# Patient Record
Sex: Female | Born: 1990 | Race: Black or African American | Hispanic: No | Marital: Single | Smoking: Current some day smoker
Health system: Southern US, Community
[De-identification: ages and names within clinical notes are randomized; demographics above are authoritative.]

## PROBLEM LIST (undated history)

## (undated) ENCOUNTER — Inpatient Hospital Stay (HOSPITAL_COMMUNITY): Payer: Self-pay

## (undated) DIAGNOSIS — S81839A Puncture wound without foreign body, unspecified lower leg, initial encounter: Secondary | ICD-10-CM

## (undated) DIAGNOSIS — A5900 Urogenital trichomoniasis, unspecified: Secondary | ICD-10-CM

## (undated) DIAGNOSIS — W3400XA Accidental discharge from unspecified firearms or gun, initial encounter: Secondary | ICD-10-CM

## (undated) DIAGNOSIS — J45909 Unspecified asthma, uncomplicated: Secondary | ICD-10-CM

## (undated) HISTORY — PX: NO PAST SURGERIES: SHX2092

---
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
## 2009-07-02 ENCOUNTER — Emergency Department (HOSPITAL_COMMUNITY): Admission: EM | Admit: 2009-07-02 | Discharge: 2009-07-02 | Payer: Self-pay | Admitting: Emergency Medicine

## 2009-10-06 ENCOUNTER — Emergency Department (HOSPITAL_COMMUNITY): Admission: EM | Admit: 2009-10-06 | Discharge: 2009-10-06 | Payer: Self-pay | Admitting: Emergency Medicine

## 2010-01-14 ENCOUNTER — Emergency Department (HOSPITAL_COMMUNITY): Admission: EM | Admit: 2010-01-14 | Discharge: 2010-01-14 | Payer: Self-pay | Admitting: Emergency Medicine

## 2010-03-06 ENCOUNTER — Emergency Department (HOSPITAL_COMMUNITY): Admission: EM | Admit: 2010-03-06 | Discharge: 2010-03-06 | Payer: Self-pay | Admitting: Emergency Medicine

## 2010-03-19 ENCOUNTER — Emergency Department (HOSPITAL_COMMUNITY): Admission: EM | Admit: 2010-03-19 | Discharge: 2010-03-20 | Payer: Self-pay | Admitting: Emergency Medicine

## 2010-04-21 ENCOUNTER — Emergency Department (HOSPITAL_COMMUNITY): Admission: EM | Admit: 2010-04-21 | Discharge: 2010-04-22 | Payer: Self-pay | Admitting: Emergency Medicine

## 2010-04-21 DIAGNOSIS — S81839A Puncture wound without foreign body, unspecified lower leg, initial encounter: Secondary | ICD-10-CM

## 2010-04-21 HISTORY — DX: Puncture wound without foreign body, unspecified lower leg, initial encounter: S81.839A

## 2010-05-06 ENCOUNTER — Emergency Department (HOSPITAL_COMMUNITY): Admission: EM | Admit: 2010-05-06 | Discharge: 2010-05-06 | Payer: Self-pay | Admitting: Emergency Medicine

## 2010-05-14 ENCOUNTER — Emergency Department (HOSPITAL_COMMUNITY): Admission: EM | Admit: 2010-05-14 | Discharge: 2010-05-14 | Payer: Self-pay | Admitting: Emergency Medicine

## 2010-07-29 ENCOUNTER — Emergency Department (HOSPITAL_COMMUNITY)
Admission: EM | Admit: 2010-07-29 | Discharge: 2010-07-29 | Payer: Self-pay | Source: Home / Self Care | Admitting: Emergency Medicine

## 2010-07-30 ENCOUNTER — Emergency Department (HOSPITAL_COMMUNITY)
Admission: EM | Admit: 2010-07-30 | Discharge: 2010-07-30 | Payer: Self-pay | Source: Home / Self Care | Admitting: Emergency Medicine

## 2010-08-14 ENCOUNTER — Emergency Department (HOSPITAL_COMMUNITY)
Admission: EM | Admit: 2010-08-14 | Discharge: 2010-08-14 | Disposition: A | Discharge status: Home / Self Care | Payer: Self-pay | Attending: Emergency Medicine | Admitting: Emergency Medicine

## 2010-08-14 DIAGNOSIS — M79609 Pain in unspecified limb: Secondary | ICD-10-CM | POA: Insufficient documentation

## 2010-08-14 DIAGNOSIS — G8929 Other chronic pain: Secondary | ICD-10-CM | POA: Insufficient documentation

## 2010-08-14 DIAGNOSIS — M25579 Pain in unspecified ankle and joints of unspecified foot: Secondary | ICD-10-CM | POA: Insufficient documentation

## 2010-09-19 LAB — RAPID STREP SCREEN (MED CTR MEBANE ONLY): Streptococcus, Group A Screen (Direct): NEGATIVE

## 2010-09-20 LAB — RAPID STREP SCREEN (MED CTR MEBANE ONLY): Streptococcus, Group A Screen (Direct): NEGATIVE

## 2010-09-22 LAB — URINALYSIS, ROUTINE W REFLEX MICROSCOPIC
Bilirubin Urine: NEGATIVE
Glucose, UA: NEGATIVE mg/dL
Hgb urine dipstick: NEGATIVE
Ketones, ur: NEGATIVE mg/dL
Nitrite: NEGATIVE
Protein, ur: 30 mg/dL — AB
Specific Gravity, Urine: 1.017 (ref 1.005–1.030)
Urobilinogen, UA: 1 mg/dL (ref 0.0–1.0)
pH: 8.5 — ABNORMAL HIGH (ref 5.0–8.0)

## 2010-09-22 LAB — URINE MICROSCOPIC-ADD ON

## 2010-09-22 LAB — WET PREP, GENITAL
Clue Cells Wet Prep HPF POC: NONE SEEN
Trich, Wet Prep: NONE SEEN
WBC, Wet Prep HPF POC: NONE SEEN
Yeast Wet Prep HPF POC: NONE SEEN

## 2010-09-22 LAB — URINE CULTURE: Colony Count: 45000

## 2010-09-22 LAB — GC/CHLAMYDIA PROBE AMP, GENITAL
Chlamydia, DNA Probe: NEGATIVE
GC Probe Amp, Genital: NEGATIVE

## 2010-09-22 LAB — POCT PREGNANCY, URINE: Preg Test, Ur: NEGATIVE

## 2010-09-25 LAB — RAPID URINE DRUG SCREEN, HOSP PERFORMED
Amphetamines: NOT DETECTED
Barbiturates: NOT DETECTED
Benzodiazepines: NOT DETECTED
Cocaine: NOT DETECTED
Opiates: NOT DETECTED
Tetrahydrocannabinol: POSITIVE — AB

## 2010-09-25 LAB — POCT I-STAT, CHEM 8
BUN: 6 mg/dL (ref 6–23)
Calcium, Ion: 1.14 mmol/L (ref 1.12–1.32)
Chloride: 106 mEq/L (ref 96–112)
Creatinine, Ser: 0.7 mg/dL (ref 0.4–1.2)
Glucose, Bld: 85 mg/dL (ref 70–99)
HCT: 38 % (ref 36.0–46.0)
Hemoglobin: 12.9 g/dL (ref 12.0–15.0)
Potassium: 3.8 mEq/L (ref 3.5–5.1)
Sodium: 138 mEq/L (ref 135–145)
TCO2: 23 mmol/L (ref 0–100)

## 2010-09-25 LAB — POCT PREGNANCY, URINE: Preg Test, Ur: NEGATIVE

## 2010-09-25 LAB — ETHANOL: Alcohol, Ethyl (B): <5 mg/dL (ref 0–10)
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
## 2010-12-17 ENCOUNTER — Emergency Department (HOSPITAL_COMMUNITY)
Admission: EM | Admit: 2010-12-17 | Discharge: 2010-12-17 | Disposition: A | Discharge status: Home / Self Care | Payer: Medicaid Other | Attending: Emergency Medicine | Admitting: Emergency Medicine

## 2010-12-17 DIAGNOSIS — G8929 Other chronic pain: Secondary | ICD-10-CM | POA: Insufficient documentation

## 2010-12-17 DIAGNOSIS — M79609 Pain in unspecified limb: Secondary | ICD-10-CM

## 2010-12-17 DIAGNOSIS — E049 Nontoxic goiter, unspecified: Secondary | ICD-10-CM | POA: Insufficient documentation

## 2010-12-17 DIAGNOSIS — R220 Localized swelling, mass and lump, head: Secondary | ICD-10-CM | POA: Insufficient documentation

## 2010-12-17 DIAGNOSIS — Z87828 Personal history of other (healed) physical injury and trauma: Secondary | ICD-10-CM | POA: Insufficient documentation

## 2010-12-18 LAB — TSH: TSH: 3.025 u[IU]/mL (ref 0.350–4.500)

## 2010-12-18 LAB — T4, FREE: Free T4: 0.87 ng/dL (ref 0.80–1.80)

## 2011-04-05 ENCOUNTER — Inpatient Hospital Stay (INDEPENDENT_AMBULATORY_CARE_PROVIDER_SITE_OTHER)
Admission: RE | Admit: 2011-04-05 | Discharge: 2011-04-05 | Disposition: A | Discharge status: Home / Self Care | Payer: Self-pay | Source: Ambulatory Visit | Attending: Emergency Medicine | Admitting: Emergency Medicine

## 2011-04-05 DIAGNOSIS — N61 Mastitis without abscess: Secondary | ICD-10-CM

## 2011-04-08 LAB — CULTURE, ROUTINE-ABSCESS

## 2011-12-13 ENCOUNTER — Encounter (HOSPITAL_COMMUNITY): Payer: Self-pay

## 2011-12-13 ENCOUNTER — Inpatient Hospital Stay (HOSPITAL_COMMUNITY)
Admission: AD | Admit: 2011-12-13 | Discharge: 2011-12-13 | Disposition: A | Discharge status: Home / Self Care | Payer: Medicaid Other | Source: Ambulatory Visit | Attending: Obstetrics & Gynecology | Admitting: Obstetrics & Gynecology

## 2011-12-13 ENCOUNTER — Inpatient Hospital Stay (HOSPITAL_COMMUNITY): Payer: Self-pay

## 2011-12-13 DIAGNOSIS — M25559 Pain in unspecified hip: Secondary | ICD-10-CM | POA: Insufficient documentation

## 2011-12-13 DIAGNOSIS — O99891 Other specified diseases and conditions complicating pregnancy: Secondary | ICD-10-CM | POA: Insufficient documentation

## 2011-12-13 DIAGNOSIS — R109 Unspecified abdominal pain: Secondary | ICD-10-CM | POA: Insufficient documentation

## 2011-12-13 DIAGNOSIS — O2690 Pregnancy related conditions, unspecified, unspecified trimester: Secondary | ICD-10-CM

## 2011-12-13 HISTORY — DX: Accidental discharge from unspecified firearms or gun, initial encounter: W34.00XA

## 2011-12-13 HISTORY — DX: Puncture wound without foreign body, unspecified lower leg, initial encounter: S81.839A

## 2011-12-13 LAB — POCT PREGNANCY, URINE: Preg Test, Ur: POSITIVE — AB

## 2011-12-13 LAB — URINALYSIS, ROUTINE W REFLEX MICROSCOPIC
Bilirubin Urine: NEGATIVE
Glucose, UA: NEGATIVE mg/dL
Hgb urine dipstick: NEGATIVE
Ketones, ur: NEGATIVE mg/dL
Leukocytes, UA: NEGATIVE
Nitrite: NEGATIVE
Protein, ur: NEGATIVE mg/dL
Specific Gravity, Urine: 1.02 (ref 1.005–1.030)
Urobilinogen, UA: 0.2 mg/dL (ref 0.0–1.0)
pH: 6.5 (ref 5.0–8.0)

## 2011-12-13 LAB — WET PREP, GENITAL
Trich, Wet Prep: NONE SEEN
Yeast Wet Prep HPF POC: NONE SEEN

## 2011-12-13 LAB — HCG, QUANTITATIVE, PREGNANCY: hCG, Beta Chain, Quant, S: 825 m[IU]/mL — ABNORMAL HIGH (ref <5)

## 2011-12-13 NOTE — MAU Provider Note (Signed)
Attestation of Attending Supervision of Advanced Practitioner: Evaluation and management procedures were performed by the OB Fellow/PA/CNM/NP under my supervision and collaboration. Chart reviewed, and agree with management and plan.  Arelyn Gauer, M.D. 12/13/2011 1:36 PM   

## 2011-12-13 NOTE — MAU Note (Signed)
Patient presents with c/o right side hip pain for 4 days, no dysuria, no vaginal discharge, LMP 11/03/11 lighter than normal
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
## 2011-12-17 ENCOUNTER — Encounter (HOSPITAL_COMMUNITY): Payer: Self-pay | Admitting: *Deleted

## 2011-12-17 ENCOUNTER — Inpatient Hospital Stay (HOSPITAL_COMMUNITY)
Admission: AD | Admit: 2011-12-17 | Discharge: 2011-12-17 | Disposition: A | Discharge status: Home / Self Care | Payer: Medicaid Other | Source: Ambulatory Visit | Attending: Obstetrics & Gynecology | Admitting: Obstetrics & Gynecology

## 2011-12-17 DIAGNOSIS — R109 Unspecified abdominal pain: Secondary | ICD-10-CM | POA: Insufficient documentation

## 2011-12-17 DIAGNOSIS — O0281 Inappropriate change in quantitative human chorionic gonadotropin (hCG) in early pregnancy: Secondary | ICD-10-CM

## 2011-12-17 DIAGNOSIS — O99891 Other specified diseases and conditions complicating pregnancy: Secondary | ICD-10-CM | POA: Insufficient documentation

## 2011-12-17 HISTORY — DX: Urogenital trichomoniasis, unspecified: A59.00
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
## 2011-12-17 NOTE — MAU Note (Signed)
Feeling ok. Some cramping on lower rt side.  No bleeding.  Doesn't have appetite.

## 2011-12-24 ENCOUNTER — Ambulatory Visit (HOSPITAL_COMMUNITY): Payer: Medicaid Other | Attending: Nurse Practitioner

## 2014-01-13 ENCOUNTER — Encounter (HOSPITAL_COMMUNITY): Payer: Self-pay | Admitting: Emergency Medicine

## 2014-01-13 ENCOUNTER — Emergency Department (HOSPITAL_COMMUNITY)
Admission: EM | Admit: 2014-01-13 | Discharge: 2014-01-13 | Disposition: A | Discharge status: Home / Self Care | Payer: Self-pay | Attending: Emergency Medicine | Admitting: Emergency Medicine

## 2014-01-13 DIAGNOSIS — Z87891 Personal history of nicotine dependence: Secondary | ICD-10-CM | POA: Insufficient documentation

## 2014-01-13 DIAGNOSIS — K5289 Other specified noninfective gastroenteritis and colitis: Secondary | ICD-10-CM | POA: Insufficient documentation

## 2014-01-13 DIAGNOSIS — Z87828 Personal history of other (healed) physical injury and trauma: Secondary | ICD-10-CM | POA: Insufficient documentation

## 2014-01-13 DIAGNOSIS — K529 Noninfective gastroenteritis and colitis, unspecified: Secondary | ICD-10-CM

## 2014-01-13 DIAGNOSIS — Z3202 Encounter for pregnancy test, result negative: Secondary | ICD-10-CM | POA: Insufficient documentation

## 2014-01-13 LAB — CBC WITH DIFFERENTIAL/PLATELET
Basophils Absolute: 0 10*3/uL (ref 0.0–0.1)
Basophils Relative: 1 % (ref 0–1)
EOS ABS: 0.2 10*3/uL (ref 0.0–0.7)
Eosinophils Relative: 3 % (ref 0–5)
HCT: 37.5 % (ref 36.0–46.0)
HEMOGLOBIN: 13.7 g/dL (ref 12.0–15.0)
LYMPHS ABS: 1.4 10*3/uL (ref 0.7–4.0)
Lymphocytes Relative: 19 % (ref 12–46)
MCH: 28.2 pg (ref 26.0–34.0)
MCHC: 36.5 g/dL — ABNORMAL HIGH (ref 30.0–36.0)
MCV: 77.2 fL — ABNORMAL LOW (ref 78.0–100.0)
Monocytes Absolute: 0.3 10*3/uL (ref 0.1–1.0)
Monocytes Relative: 4 % (ref 3–12)
NEUTROS ABS: 5.5 10*3/uL (ref 1.7–7.7)
NEUTROS PCT: 73 % (ref 43–77)
PLATELETS: 243 10*3/uL (ref 150–400)
RBC: 4.86 MIL/uL (ref 3.87–5.11)
RDW: 13.8 % (ref 11.5–15.5)
WBC: 7.5 10*3/uL (ref 4.0–10.5)

## 2014-01-13 LAB — URINE MICROSCOPIC-ADD ON

## 2014-01-13 LAB — BASIC METABOLIC PANEL
ANION GAP: 15 (ref 5–15)
BUN: 7 mg/dL (ref 6–23)
CHLORIDE: 106 meq/L (ref 96–112)
CO2: 21 mEq/L (ref 19–32)
Calcium: 9.3 mg/dL (ref 8.4–10.5)
Creatinine, Ser: 0.74 mg/dL (ref 0.50–1.10)
Glucose, Bld: 130 mg/dL — ABNORMAL HIGH (ref 70–99)
POTASSIUM: 4 meq/L (ref 3.7–5.3)
SODIUM: 142 meq/L (ref 137–147)

## 2014-01-13 LAB — URINALYSIS, ROUTINE W REFLEX MICROSCOPIC
Bilirubin Urine: NEGATIVE
Glucose, UA: NEGATIVE mg/dL
KETONES UR: 40 mg/dL — AB
LEUKOCYTES UA: NEGATIVE
NITRITE: NEGATIVE
PH: 6 (ref 5.0–8.0)
PROTEIN: NEGATIVE mg/dL
Specific Gravity, Urine: 1.025 (ref 1.005–1.030)
Urobilinogen, UA: 0.2 mg/dL (ref 0.0–1.0)

## 2014-01-13 LAB — POC URINE PREG, ED: PREG TEST UR: NEGATIVE

## 2014-01-13 MED ORDER — ONDANSETRON 4 MG PO TBDP
8.0000 mg | ORAL_TABLET | Freq: Once | ORAL | Status: AC
  Administered 2014-01-13: 8 mg via ORAL
  Filled 2014-01-13: qty 2

## 2014-01-13 MED ORDER — SODIUM CHLORIDE 0.9 % IV BOLUS (SEPSIS)
1000.0000 mL | Freq: Once | INTRAVENOUS | Status: AC
  Administered 2014-01-13: 1000 mL via INTRAVENOUS

## 2014-01-13 MED ORDER — ONDANSETRON 4 MG PO TBDP: 4.0000 mg | ORAL_TABLET | Freq: Three times a day (TID) | ORAL | Status: DC | PRN

## 2014-01-13 MED ORDER — SODIUM CHLORIDE 0.9 % IV SOLN
1000.0000 mL | Freq: Once | INTRAVENOUS | Status: AC
  Administered 2014-01-13: 1000 mL via INTRAVENOUS

## 2014-01-13 MED ORDER — ONDANSETRON HCL 4 MG/2ML IJ SOLN
4.0000 mg | Freq: Once | INTRAMUSCULAR | Status: AC
Start: 2014-01-13 — End: 2014-01-13
  Administered 2014-01-13: 4 mg via INTRAVENOUS
  Filled 2014-01-13: qty 2

## 2014-01-13 NOTE — ED Notes (Signed)
Pt actively vomiting in bathroom, will give nausea meds
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
## 2014-01-13 NOTE — ED Notes (Signed)
LMP: NOW

## 2014-01-13 NOTE — ED Notes (Signed)
Patient states having abdominal pain, N/V

## 2014-01-14 NOTE — ED Provider Notes (Signed)
I saw and evaluated the patient, reviewed the resident's note and I agree with the findings and plan.   EKG Interpretation None     Patient with likely gastroenteritis. Feels better after treatment. Labs overall reassuring. Patient tolerated orals will be discharged home  Juliet Rudeathan R. Rubin PayorPickering, MD 01/14/14 1542

## 2014-01-18 NOTE — Discharge Planning (Signed)
[facility] Community Liaison was not able to see the patient, [facility] orange card information and resources will be sent to the address provided. ° °

## 2014-05-08 ENCOUNTER — Encounter (HOSPITAL_COMMUNITY): Payer: Self-pay | Admitting: Emergency Medicine

## 2014-05-11 ENCOUNTER — Ambulatory Visit: Payer: Self-pay

## 2014-05-15 ENCOUNTER — Emergency Department (HOSPITAL_COMMUNITY)
Admission: EM | Admit: 2014-05-15 | Discharge: 2014-05-15 | Disposition: A | Discharge status: Home / Self Care | Payer: Self-pay | Attending: Emergency Medicine | Admitting: Emergency Medicine

## 2014-05-15 ENCOUNTER — Encounter (HOSPITAL_COMMUNITY): Payer: Self-pay | Admitting: Emergency Medicine

## 2014-05-15 ENCOUNTER — Emergency Department (HOSPITAL_COMMUNITY): Payer: Self-pay

## 2014-05-15 DIAGNOSIS — J45901 Unspecified asthma with (acute) exacerbation: Secondary | ICD-10-CM | POA: Insufficient documentation

## 2014-05-15 DIAGNOSIS — Z8619 Personal history of other infectious and parasitic diseases: Secondary | ICD-10-CM | POA: Insufficient documentation

## 2014-05-15 DIAGNOSIS — Z87891 Personal history of nicotine dependence: Secondary | ICD-10-CM | POA: Insufficient documentation

## 2014-05-15 DIAGNOSIS — Z87828 Personal history of other (healed) physical injury and trauma: Secondary | ICD-10-CM | POA: Insufficient documentation

## 2014-05-15 LAB — COMPREHENSIVE METABOLIC PANEL
ALT: 10 U/L (ref 0–35)
ANION GAP: 16 — AB (ref 5–15)
AST: 17 U/L (ref 0–37)
Albumin: 4.3 g/dL (ref 3.5–5.2)
Alkaline Phosphatase: 58 U/L (ref 39–117)
BUN: 12 mg/dL (ref 6–23)
CALCIUM: 9.7 mg/dL (ref 8.4–10.5)
CO2: 21 meq/L (ref 19–32)
CREATININE: 0.73 mg/dL (ref 0.50–1.10)
Chloride: 101 mEq/L (ref 96–112)
GLUCOSE: 99 mg/dL (ref 70–99)
Potassium: 3.7 mEq/L (ref 3.7–5.3)
Sodium: 138 mEq/L (ref 137–147)
Total Bilirubin: 0.5 mg/dL (ref 0.3–1.2)
Total Protein: 7.5 g/dL (ref 6.0–8.3)

## 2014-05-15 LAB — CBC
HCT: 37.3 % (ref 36.0–46.0)
Hemoglobin: 13.5 g/dL (ref 12.0–15.0)
MCH: 27.9 pg (ref 26.0–34.0)
MCHC: 36.2 g/dL — ABNORMAL HIGH (ref 30.0–36.0)
MCV: 77.1 fL — ABNORMAL LOW (ref 78.0–100.0)
Platelets: 204 K/uL (ref 150–400)
RBC: 4.84 MIL/uL (ref 3.87–5.11)
RDW: 13.3 % (ref 11.5–15.5)
WBC: 4.2 K/uL (ref 4.0–10.5)

## 2014-05-15 MED ORDER — PREDNISONE 20 MG PO TABS: 40.0000 mg | ORAL_TABLET | Freq: Every day | ORAL | Status: DC

## 2014-05-15 MED ORDER — PREDNISONE 20 MG PO TABS
60.0000 mg | ORAL_TABLET | Freq: Once | ORAL | Status: AC
  Administered 2014-05-15: 60 mg via ORAL
  Filled 2014-05-15: qty 3

## 2014-05-15 MED ORDER — IBUPROFEN 800 MG PO TABS
800.0000 mg | ORAL_TABLET | Freq: Once | ORAL | Status: AC
  Administered 2014-05-15: 800 mg via ORAL
  Filled 2014-05-15: qty 1

## 2014-05-15 MED ORDER — IPRATROPIUM-ALBUTEROL 0.5-2.5 (3) MG/3ML IN SOLN
3.0000 mL | Freq: Once | RESPIRATORY_TRACT | Status: AC
  Administered 2014-05-15: 3 mL via RESPIRATORY_TRACT
  Filled 2014-05-15: qty 3

## 2014-05-15 MED ORDER — ALBUTEROL (5 MG/ML) CONTINUOUS INHALATION SOLN
10.0000 mg/h | INHALATION_SOLUTION | RESPIRATORY_TRACT | Status: AC
  Administered 2014-05-15: 10 mg/h via RESPIRATORY_TRACT
  Filled 2014-05-15: qty 20

## 2014-05-15 MED ORDER — ALBUTEROL SULFATE HFA 108 (90 BASE) MCG/ACT IN AERS: 1.0000 | INHALATION_SPRAY | Freq: Four times a day (QID) | RESPIRATORY_TRACT | Status: DC | PRN

## 2014-05-15 NOTE — ED Notes (Signed)
Pt moved from triage to hallway on pod D -- waiting for room, pt is wheezing,

## 2014-05-15 NOTE — Discharge Instructions (Signed)
Take Prednisone as directed until gone. Take albuterol as needed for wheezing. Refer to attached documents for more information.

## 2014-05-15 NOTE — ED Notes (Signed)
Wheezing cough since Friday vomits when she eats

## 2014-05-15 NOTE — ED Provider Notes (Signed)
Patient received in sign out from PA Szelkalski at shift change.  Briefly, 23 y.o. F with complaint of wheezing x 3 days.  Noted cough without fever, chills, or sick contacts.    Plan:  CXR and lab work pending.  Patient given steroids, currently on continuous neb.  If improved enough after treatment may discharge home, otherwise may need admission.  Results for orders placed or performed during the hospital encounter of 05/15/14  CBC  Result Value Ref Range   WBC 4.2 4.0 - 10.5 K/uL   RBC 4.84 3.87 - 5.11 MIL/uL   Hemoglobin 13.5 12.0 - 15.0 g/dL   HCT 84.637.3 96.236.0 - 95.246.0 %   MCV 77.1 (L) 78.0 - 100.0 fL   MCH 27.9 26.0 - 34.0 pg   MCHC 36.2 (H) 30.0 - 36.0 g/dL   RDW 84.113.3 32.411.5 - 40.115.5 %   Platelets 204 150 - 400 K/uL  Comprehensive metabolic panel  Result Value Ref Range   Sodium 138 137 - 147 mEq/L   Potassium 3.7 3.7 - 5.3 mEq/L   Chloride 101 96 - 112 mEq/L   CO2 21 19 - 32 mEq/L   Glucose, Bld 99 70 - 99 mg/dL   BUN 12 6 - 23 mg/dL   Creatinine, Ser 0.270.73 0.50 - 1.10 mg/dL   Calcium 9.7 8.4 - 25.310.5 mg/dL   Total Protein 7.5 6.0 - 8.3 g/dL   Albumin 4.3 3.5 - 5.2 g/dL   AST 17 0 - 37 U/L   ALT 10 0 - 35 U/L   Alkaline Phosphatase 58 39 - 117 U/L   Total Bilirubin 0.5 0.3 - 1.2 mg/dL   GFR calc non Af Amer >90 >90 mL/min   GFR calc Af Amer >90 >90 mL/min   Anion gap 16 (H) 5 - 15   Dg Chest 2 View  05/15/2014   CLINICAL DATA:  Wheezing since 05/12/2014.  History of asthma.  EXAM: CHEST  2 VIEW  COMPARISON:  None.  FINDINGS: The heart size and mediastinal contours are within normal limits. Both lungs are clear. The visualized skeletal structures are unremarkable.  IMPRESSION: No active cardiopulmonary disease.   Electronically Signed   By: Elberta Fortisaniel  Boyle M.D.   On: 05/15/2014 15:15    4:07 PM Patient reassessed after hour long neb.  States she feels she is breathing better than before, but still feels wheezy.  Patient does continue to have mild end expiratory wheezes on exam.  O2  sats currently stable on RA.  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  CXR and lab work reassuring here today.  Will ambulate in hallway to ensure proper oxygenation prior to discharge.  Patient able to ambulate without difficulty, maintained O2 sats of 100, no distress noted.  Mild tachycardia noted, likely from high doses of albuterol.  Patient will be discharged home with steroids and albuterol inhaler.  Recommended close FU with PCP.  Discussed plan with patient, he/she acknowledged understanding and agreed with plan of care.  Return precautions given for new or worsening symptoms.  Garlon HatchetLisa M Rimas Gilham, PA-C 05/15/14 1917  Enid SkeensJoshua M Zavitz, MD 05/16/14 [phone]
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
## 2014-05-15 NOTE — ED Notes (Addendum)
Sitting Sats 100% RA with HR of 127. Ambulating 99% RA with HR 124

## 2017-12-12 ENCOUNTER — Other Ambulatory Visit: Payer: Self-pay

## 2017-12-12 ENCOUNTER — Inpatient Hospital Stay (HOSPITAL_COMMUNITY)
Admission: AD | Admit: 2017-12-12 | Discharge: 2017-12-12 | Disposition: A | Discharge status: Home / Self Care | Payer: Self-pay | Source: Ambulatory Visit | Attending: Obstetrics & Gynecology | Admitting: Obstetrics & Gynecology

## 2017-12-12 ENCOUNTER — Encounter: Payer: Self-pay | Admitting: Obstetrics and Gynecology

## 2017-12-12 ENCOUNTER — Inpatient Hospital Stay (HOSPITAL_COMMUNITY): Payer: Self-pay

## 2017-12-12 DIAGNOSIS — O039 Complete or unspecified spontaneous abortion without complication: Secondary | ICD-10-CM

## 2017-12-12 DIAGNOSIS — Z8742 Personal history of other diseases of the female genital tract: Secondary | ICD-10-CM

## 2017-12-12 DIAGNOSIS — Z3A09 9 weeks gestation of pregnancy: Secondary | ICD-10-CM

## 2017-12-12 DIAGNOSIS — Z87891 Personal history of nicotine dependence: Secondary | ICD-10-CM | POA: Insufficient documentation

## 2017-12-12 HISTORY — DX: Unspecified asthma, uncomplicated: J45.909

## 2017-12-12 LAB — CBC WITH DIFFERENTIAL/PLATELET
BASOS PCT: 0 %
Basophils Absolute: 0 10*3/uL (ref 0.0–0.1)
EOS PCT: 1 %
Eosinophils Absolute: 0.1 10*3/uL (ref 0.0–0.7)
HCT: 36.5 % (ref 36.0–46.0)
Hemoglobin: 13.8 g/dL (ref 12.0–15.0)
LYMPHS PCT: 14 %
Lymphs Abs: 1.4 10*3/uL (ref 0.7–4.0)
MCH: 28.8 pg (ref 26.0–34.0)
MCHC: 37.2 g/dL — ABNORMAL HIGH (ref 30.0–36.0)
MCV: 76 fL — AB (ref 78.0–100.0)
MONO ABS: 0.2 10*3/uL (ref 0.1–1.0)
Monocytes Relative: 2 %
Neutro Abs: 8.4 10*3/uL — ABNORMAL HIGH (ref 1.7–7.7)
Neutrophils Relative %: 83 %
Platelets: 288 10*3/uL (ref 150–400)
RBC: 4.8 MIL/uL (ref 3.87–5.11)
RDW: 13.6 % (ref 11.5–15.5)
WBC: 10.2 10*3/uL (ref 4.0–10.5)

## 2017-12-12 LAB — COMPREHENSIVE METABOLIC PANEL
ALK PHOS: 50 U/L (ref 38–126)
ALT: 9 U/L — ABNORMAL LOW (ref 14–54)
AST: 15 U/L (ref 15–41)
Albumin: 4.1 g/dL (ref 3.5–5.0)
Anion gap: 9 (ref 5–15)
BUN: 8 mg/dL (ref 6–20)
CO2: 23 mmol/L (ref 22–32)
Calcium: 9.7 mg/dL (ref 8.9–10.3)
Chloride: 105 mmol/L (ref 101–111)
Creatinine, Ser: 0.54 mg/dL (ref 0.44–1.00)
GFR calc Af Amer: >60 mL/min (ref >60)
GLUCOSE: 111 mg/dL — AB (ref 65–99)
Potassium: 4.3 mmol/L (ref 3.5–5.1)
Sodium: 137 mmol/L (ref 135–145)
TOTAL PROTEIN: 7.6 g/dL (ref 6.5–8.1)
Total Bilirubin: 0.7 mg/dL (ref 0.3–1.2)

## 2017-12-12 LAB — HCG, QUANTITATIVE, PREGNANCY: HCG, BETA CHAIN, QUANT, S: 75961 m[IU]/mL — AB (ref <5)

## 2017-12-12 LAB — TYPE AND SCREEN
ABO/RH(D): A POS
Antibody Screen: POSITIVE
PT AG Type: NEGATIVE

## 2017-12-12 MED ORDER — MISOPROSTOL 200 MCG PO TABS
800.0000 ug | ORAL_TABLET | Freq: Once | ORAL | Status: AC
  Administered 2017-12-12: 800 ug via BUCCAL
  Filled 2017-12-12: qty 4

## 2017-12-12 MED ORDER — LACTATED RINGERS IV BOLUS
1000.0000 mL | Freq: Once | INTRAVENOUS | Status: AC
  Administered 2017-12-12: 1000 mL via INTRAVENOUS

## 2017-12-12 MED ORDER — MISOPROSTOL 200 MCG PO TABS: 200.0000 ug | ORAL_TABLET | Freq: Three times a day (TID) | ORAL | 0 refills | Status: DC

## 2017-12-12 MED ORDER — KETOROLAC TROMETHAMINE 30 MG/ML IJ SOLN
30.0000 mg | Freq: Once | INTRAMUSCULAR | Status: AC
  Administered 2017-12-12: 30 mg via INTRAVENOUS
  Filled 2017-12-12: qty 1

## 2017-12-12 MED ORDER — HYDROMORPHONE HCL 1 MG/ML IJ SOLN
0.5000 mg | Freq: Once | INTRAMUSCULAR | Status: AC
  Administered 2017-12-12: 0.5 mg via INTRAVENOUS
  Filled 2017-12-12: qty 1

## 2017-12-12 MED ORDER — METHYLERGONOVINE MALEATE 0.2 MG/ML IJ SOLN
0.2000 mg | Freq: Once | INTRAMUSCULAR | Status: AC
  Administered 2017-12-12: 0.2 mg via INTRAMUSCULAR
  Filled 2017-12-12: qty 1

## 2017-12-12 MED ORDER — IBUPROFEN 800 MG PO TABS: 800.0000 mg | ORAL_TABLET | Freq: Three times a day (TID) | ORAL | 0 refills | Status: DC

## 2017-12-12 MED ORDER — OXYCODONE-ACETAMINOPHEN 5-325 MG PO TABS: 1.0000 | ORAL_TABLET | Freq: Four times a day (QID) | ORAL | 0 refills | Status: DC | PRN

## 2017-12-12 NOTE — Progress Notes (Addendum)
Assumed care of pt. Lab at bs with two nurses and provider. Lab being drawn and another IV being inserted   1005: 2nd IV inserted with labs drawn by RN Joni ReiningNicole  1007: Bedside U/S done by provider but ordered for transvaginal U/S bs. U/S sonographer notified. Informed sonographer to refer questions to provider for reason for transvag u/s.   1016: U/S sonographer at bs.   MD at bs assessing pt. Speculum exam done and POC taken out of the os. POC to lab  MD Eure ordered for 0.2 mg methergine IM and 800 mcg cytotec PO buccal   Medicated with toradol and dilaudid per order  POC to patho with requisition.   1057: Medicated with methergine and cytotec per order.   1120: hearts and information pack for fetal loss given along with a shawl due to pt's cont. Fetal loss x4. Informed pt bereavement counseling available when needed with pertinent information in the pack.   Assisted pt up to bathroom  1210: IV d/c'd for discharge per order  1217: D/c instructions given with pt understanding. Pt left unit via ambulatory with family members.
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
## 2017-12-12 NOTE — Discharge Instructions (Signed)

## 2017-12-14 ENCOUNTER — Telehealth: Payer: Self-pay | Admitting: General Practice

## 2017-12-14 NOTE — Telephone Encounter (Signed)
Called and notified patient of appointments for Lab visit (12/16/17) and 12/24/17 for SAB follow up.  Patient verbalized understanding.

## 2017-12-15 ENCOUNTER — Other Ambulatory Visit: Payer: Self-pay

## 2017-12-15 DIAGNOSIS — O039 Complete or unspecified spontaneous abortion without complication: Secondary | ICD-10-CM

## 2017-12-16 ENCOUNTER — Other Ambulatory Visit: Payer: Self-pay

## 2017-12-16 DIAGNOSIS — O039 Complete or unspecified spontaneous abortion without complication: Secondary | ICD-10-CM

## 2017-12-17 LAB — BETA HCG QUANT (REF LAB): HCG QUANT: 1895 m[IU]/mL

## 2017-12-21 ENCOUNTER — Telehealth: Payer: Self-pay | Admitting: *Deleted

## 2017-12-21 ENCOUNTER — Telehealth: Payer: Self-pay | Admitting: General Practice

## 2017-12-21 NOTE — Telephone Encounter (Signed)
-----   Message from Adam PhenixJames G Arnold, MD sent at 12/18/2017  8:51 PM EDT ----- Drop in HCG but needs repeat next week and follow to zero

## 2017-12-21 NOTE — Telephone Encounter (Signed)
Called patient to clarify appointment on 12/24/17 at 3:35pm with labs as well.  Patient verbalized understanding.

## 2017-12-21 NOTE — Telephone Encounter (Signed)
Called pt and informed her of recent lab results showing the the pregnancy hormone level has decreased. This test needs to be repeated weekly until the level is 0. Pt voiced understanding and agreed to lab appt on 6/21 @ 1530.

## 2017-12-24 ENCOUNTER — Ambulatory Visit: Payer: Self-pay | Admitting: Student

## 2017-12-24 ENCOUNTER — Ambulatory Visit: Payer: Self-pay | Admitting: Advanced Practice Midwife

## 2017-12-25 ENCOUNTER — Other Ambulatory Visit: Payer: Self-pay

## 2018-02-12 ENCOUNTER — Ambulatory Visit: Payer: Self-pay

## 2018-06-14 ENCOUNTER — Ambulatory Visit: Payer: Self-pay

## 2018-10-08 ENCOUNTER — Encounter (HOSPITAL_COMMUNITY): Payer: Self-pay

## 2019-05-17 ENCOUNTER — Encounter (HOSPITAL_COMMUNITY): Payer: Self-pay

## 2019-05-17 ENCOUNTER — Other Ambulatory Visit: Payer: Self-pay

## 2019-05-17 ENCOUNTER — Inpatient Hospital Stay (HOSPITAL_COMMUNITY)
Admission: AD | Admit: 2019-05-17 | Discharge: 2019-05-17 | Disposition: A | Discharge status: Home / Self Care | Payer: Self-pay | Attending: Obstetrics and Gynecology | Admitting: Obstetrics and Gynecology

## 2019-05-17 DIAGNOSIS — Z3201 Encounter for pregnancy test, result positive: Secondary | ICD-10-CM | POA: Insufficient documentation

## 2019-05-17 LAB — POCT PREGNANCY, URINE: Preg Test, Ur: POSITIVE — AB

## 2019-05-17 MED ORDER — PREPLUS 27-1 MG PO TABS: 1.0000 | ORAL_TABLET | Freq: Every day | ORAL | 13 refills | Status: DC

## 2019-05-17 NOTE — MAU Note (Signed)
Pt states that she took two pregnancy test at home and they were both positive.   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   Pt reports also wanting to get on some prenatal medication while she is here.  Denies vaginal bleeding. Denies pain.

## 2019-05-17 NOTE — Discharge Instructions (Signed)
Prenatal Care Prenatal care is health care during pregnancy. It helps you and your unborn baby (fetus) stay as healthy as possible. Prenatal care may be provided by a midwife, a family practice health care provider, or a childbirth and pregnancy specialist (obstetrician). How does this affect me? During pregnancy, you will be closely monitored for any new conditions that might develop. To lower your risk of pregnancy complications, you and your health care provider will talk about any underlying conditions you have. How does this affect my baby? Early and consistent prenatal care increases the chance that your baby will be healthy during pregnancy. Prenatal care lowers the risk that your baby will be:  Born early (prematurely).  Smaller than expected at birth (small for gestational age). What can I expect at the first prenatal care visit? Your first prenatal care visit will likely be the longest. You should schedule your first prenatal care visit as soon as you know that you are pregnant. Your first visit is a good time to talk about any questions or concerns you have about pregnancy. At your visit, you and your health care provider will talk about:  Your medical history, including: ? Any past pregnancies. ? Your family's medical history. ? The baby's father's medical history. ? Any long-term (chronic) health conditions you have and how you manage them. ? Any surgeries or procedures you have had. ? Any current over-the-counter or prescription medicines, herbs, or supplements you are taking.  Other factors that could pose a risk to your baby, including:  Your home setting and your stress levels, including: ? Exposure to abuse or violence. ? Household financial strain. ? Mental health conditions you have.  Your daily health habits, including diet and exercise. Your health care provider will also:  Measure your weight, height, and blood pressure.  Do a physical exam, including a pelvic  and breast exam.  Perform blood tests and urine tests to check for: ? Urinary tract infection. ? Sexually transmitted infections (STIs). ? Low iron levels in your blood (anemia). ? Blood type and certain proteins on red blood cells (Rh antibodies). ? Infections and immunity to viruses, such as hepatitis B and rubella. ? HIV (human immunodeficiency virus).  Do an ultrasound to confirm your baby's growth and development and to help predict your estimated due date (EDD). This ultrasound is done with a probe that is inserted into the vagina (transvaginal ultrasound).  Discuss your options for genetic screening.  Give you information about how to keep yourself and your baby healthy, including: ? Nutrition and taking vitamins. ? Physical activity. ? How to manage pregnancy symptoms such as nausea and vomiting (morning sickness). ? Infections and substances that may be harmful to your baby and how to avoid them. ? Food safety. ? Dental care. ? Working. ? Travel. ? Warning signs to watch for and when to call your health care provider. How often will I have prenatal care visits? After your first prenatal care visit, you will have regular visits throughout your pregnancy. The visit schedule is often as follows:  Up to week 28 of pregnancy: once every 4 weeks.  28-36 weeks: once every 2 weeks.  After 36 weeks: every week until delivery. Some women may have visits more or less often depending on any underlying health conditions and the health of the baby. Keep all follow-up and prenatal care visits as told by your health care provider. This is important. What happens during routine prenatal care visits? Your health care provider will:    Measure your weight and blood pressure.  Check for fetal heart sounds.  Measure the height of your uterus in your abdomen (fundal height). This may be measured starting around week 20 of pregnancy.  Check the position of your baby inside your  uterus.  Ask questions about your diet, sleeping patterns, and whether you can feel the baby move.  Review warning signs to watch for and signs of labor.  Ask about any pregnancy symptoms you are having and how you are dealing with them. Symptoms may include: ? Headaches. ? Nausea and vomiting. ? Vaginal discharge. ? Swelling. ? Fatigue. ? Constipation. ? Any discomfort, including back or pelvic pain. Make a list of questions to ask your health care provider at your routine visits. What tests might I have during prenatal care visits? You may have blood, urine, and imaging tests throughout your pregnancy, such as:  Urine tests to check for glucose, protein, or signs of infection.  Glucose tests to check for a form of diabetes that can develop during pregnancy (gestational diabetes mellitus). This is usually done around week 24 of pregnancy.  An ultrasound to check your baby's growth and development and to check for birth defects. This is usually done around week 20 of pregnancy.  A test to check for group B strep (GBS) infection. This is usually done around week 36 of pregnancy.  Genetic testing. This may include blood or imaging tests, such as an ultrasound. Some genetic tests are done during the first trimester and some are done during the second trimester. What else can I expect during prenatal care visits? Your health care provider may recommend getting certain vaccines during pregnancy. These may include:  A yearly flu shot (annual influenza vaccine). This is especially important if you will be pregnant during flu season.  Tdap (tetanus, diphtheria, pertussis) vaccine. Getting this vaccine during pregnancy can protect your baby from whooping cough (pertussis) after birth. This vaccine may be recommended between weeks 27 and 36 of pregnancy. Later in your pregnancy, your health care provider may give you information about:  Childbirth and breastfeeding classes.  Choosing a  health care provider for your baby.  Umbilical cord banking.  Breastfeeding.  Birth control after your baby is born.  The hospital labor and delivery unit and how to tour it.  Registering at the hospital before you go into labor. Where to find more information  Office on Women's Health: LegalWarrants.gl  American Pregnancy Association: americanpregnancy.org  March of Dimes: marchofdimes.org Summary  Prenatal care helps you and your baby stay as healthy as possible during pregnancy.  Your first prenatal care visit will most likely be the longest.  You will have visits and tests throughout your pregnancy to monitor your health and your baby's health.  Bring a list of questions to your visits to ask your health care provider.  Make sure to keep all follow-up and prenatal care visits with your health care provider. This information is not intended to replace advice given to you by your health care provider. Make sure you discuss any questions you have with your health care provider. Document Released: 06/26/2003 Document Revised: 10/13/2018 Document Reviewed: 06/22/2017 Elsevier Patient Education  [place] Medications in Pregnancy   Acne: Benzoyl Peroxide Salicylic Acid  Backache/Headache: Tylenol: 2 regular strength every 4 hours OR              2 Extra strength every 6 hours  Colds/Coughs/Allergies: Benadryl (alcohol free) 25 mg every 6 hours  as needed Breath right strips Claritin Cepacol throat lozenges Chloraseptic throat spray Cold-Eeze- up to three times per day Cough drops, alcohol free Flonase (by prescription only) Guaifenesin Mucinex Robitussin DM (plain only, alcohol free) Saline nasal spray/drops Sudafed (pseudoephedrine) & Actifed ** use only after [redacted] weeks gestation and if you do not have high blood pressure Tylenol Vicks Vaporub Zinc lozenges Zyrtec   Constipation: Colace Ducolax suppositories Fleet enema Glycerin  suppositories Metamucil Milk of magnesia Miralax Senokot Smooth move tea  Diarrhea: Kaopectate Imodium A-D  *NO pepto Bismol  Hemorrhoids: Anusol Anusol HC Preparation H Tucks  Indigestion: Tums Maalox Mylanta Zantac  Pepcid  Insomnia: Benadryl (alcohol free) 25mg  every 6 hours as needed Tylenol PM Unisom, no Gelcaps  Leg Cramps: Tums MagGel  Nausea/Vomiting:  Bonine Dramamine Emetrol Ginger extract Sea bands Meclizine  Nausea medication to take during pregnancy:  Unisom (doxylamine succinate 25 mg tablets) Take one tablet daily at bedtime. If symptoms are not adequately controlled, the dose can be increased to a maximum recommended dose of two tablets daily (1/2 tablet in the morning, 1/2 tablet mid-afternoon and one at bedtime). Vitamin B6 100mg  tablets. Take one tablet twice a day (up to 200 mg per day).  Skin Rashes: Aveeno products Benadryl cream or 25mg  every 6 hours as needed Calamine Lotion 1% cortisone cream  Yeast infection: Gyne-lotrimin 7 Monistat 7   **If taking multiple medications, please check labels to avoid duplicating the same active ingredients **take medication as directed on the label ** Do not exceed 4000 mg of tylenol in 24 hours **Do not take medications that contain aspirin or ibuprofen      [facility] for [facility] Prenatal Care Providers          Center for [facility] Healthcare @ [facility]   Phone: [phone]  Center for [facility] Healthcare @ [facility]   Phone: [phone]  [facility] Healthcare @[place]       Phone: [phone]            Center for [facility] Healthcare @ [place]     Phone: [phone]          Center for [facility] Healthcare @ [facility]   Phone: [phone]  Center for [facility] Healthcare @ [facility]  Phone: [phone]     Center for [facility] Healthcare @ [facility] [address])  Phone: [phone]

## 2019-05-17 NOTE — MAU Provider Note (Signed)
First Provider Initiated Contact with Patient 05/17/19 1533     S [name] is a 28 y.o. G25P0040 pregnant female who presents to MAU today for pregnancy confirmation after two positive home pregnancy tests. She denies pain or bleeding. 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 She is also concerned that she is self-pay and cannot get in to a clinic for discussion of how to prevent miscarriage.   O BP 126/70 (BP Location: Right Arm)   Pulse 78   Temp 98.4 F (36.9 C) (Oral)   Resp 20   LMP 04/21/2019 (Within Days)   SpO2 98%    Physical Exam  Nursing note and vitals reviewed. Constitutional: She appears well-developed and well-nourished.  Cardiovascular: Normal rate.  Respiratory: Effort normal.  GI: Soft.  Skin: Skin is warm and dry.  Psychiatric: She has a normal mood and affect. Her behavior is normal. Judgment and thought content normal.    A Pregnant female Medical screening exam complete No ob-related complaints or concerns  P Positive urine pregnancy test Patient given paper rx for prenatal vitamin Given list of [facility] providers who will accept self-pay/medicaid pending patients Given list of OTC safe medications in pregnancy Discharge from MAU in stable condition Warning signs for worsening condition that would warrant emergency follow-up discussed Patient may return to MAU as needed for pregnancy related complaints  Mallie Snooks, CNM 05/17/2019 4:31 PM

## 2019-08-13 ENCOUNTER — Emergency Department (HOSPITAL_COMMUNITY)
Admission: EM | Admit: 2019-08-13 | Discharge: 2019-08-13 | Disposition: A | Discharge status: Home / Self Care | Payer: Medicaid Other | Attending: Emergency Medicine | Admitting: Emergency Medicine

## 2019-08-13 ENCOUNTER — Other Ambulatory Visit: Payer: Self-pay

## 2019-08-13 ENCOUNTER — Encounter (HOSPITAL_COMMUNITY): Payer: Self-pay | Admitting: *Deleted

## 2019-08-13 DIAGNOSIS — O99891 Other specified diseases and conditions complicating pregnancy: Secondary | ICD-10-CM | POA: Diagnosis not present

## 2019-08-13 DIAGNOSIS — Z87891 Personal history of nicotine dependence: Secondary | ICD-10-CM | POA: Diagnosis not present

## 2019-08-13 DIAGNOSIS — J45909 Unspecified asthma, uncomplicated: Secondary | ICD-10-CM | POA: Diagnosis not present

## 2019-08-13 DIAGNOSIS — K0381 Cracked tooth: Secondary | ICD-10-CM | POA: Insufficient documentation

## 2019-08-13 DIAGNOSIS — Z3A17 17 weeks gestation of pregnancy: Secondary | ICD-10-CM | POA: Insufficient documentation

## 2019-08-13 DIAGNOSIS — Z79899 Other long term (current) drug therapy: Secondary | ICD-10-CM | POA: Insufficient documentation

## 2019-08-13 DIAGNOSIS — K029 Dental caries, unspecified: Secondary | ICD-10-CM | POA: Insufficient documentation

## 2019-08-13 DIAGNOSIS — K0889 Other specified disorders of teeth and supporting structures: Secondary | ICD-10-CM

## 2019-08-13 DIAGNOSIS — O99612 Diseases of the digestive system complicating pregnancy, second trimester: Secondary | ICD-10-CM | POA: Diagnosis not present

## 2019-08-13 DIAGNOSIS — O99512 Diseases of the respiratory system complicating pregnancy, second trimester: Secondary | ICD-10-CM | POA: Diagnosis not present

## 2019-08-13 MED ORDER — AMOXICILLIN 500 MG PO CAPS
500.0000 mg | ORAL_CAPSULE | Freq: Once | ORAL | Status: AC
  Administered 2019-08-13: 22:00:00 500 mg via ORAL
  Filled 2019-08-13: qty 1

## 2019-08-13 MED ORDER — ACETAMINOPHEN 500 MG PO TABS
1000.0000 mg | ORAL_TABLET | Freq: Once | ORAL | Status: AC
  Administered 2019-08-13: 22:00:00 1000 mg via ORAL
  Filled 2019-08-13: qty 2

## 2019-08-13 MED ORDER — BUPIVACAINE-EPINEPHRINE (PF) 0.5% -1:200000 IJ SOLN
1.8000 mL | Freq: Once | INTRAMUSCULAR | Status: AC
  Administered 2019-08-13: 23:00:00 1.8 mL
  Filled 2019-08-13: qty 1.8

## 2019-08-13 MED ORDER — AMOXICILLIN 500 MG PO CAPS: 500.0000 mg | ORAL_CAPSULE | Freq: Three times a day (TID) | ORAL | 0 refills | Status: DC

## 2019-08-13 NOTE — ED Notes (Signed)
Patient verbalizes understanding of discharge instructions. Opportunity for questioning and answers were provided. Armband removed by staff, pt discharged from ED. Pt. ambulatory and discharged home.  

## 2019-08-13 NOTE — Discharge Instructions (Addendum)
Follow-up with a dentist.  Only a dentist can fix your problem.  We recommend that you take amoxicillin as prescribed to treat likely underlying infection.  Take Tylenol for pain control.  Do not take more than 4,000mg  of Tylenol in 24 hours.  Return to the ED for any new or concerning symptoms.
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
## 2019-08-13 NOTE — ED Triage Notes (Signed)
Pt arrives, left lower dental pain for several days, has taken tylenol for the same, denies fevers. ([redacted] weeks pregnant)

## 2019-08-26 ENCOUNTER — Other Ambulatory Visit: Payer: Self-pay

## 2019-08-26 ENCOUNTER — Inpatient Hospital Stay (HOSPITAL_COMMUNITY)
Admission: AD | Admit: 2019-08-26 | Discharge: 2019-08-26 | Disposition: A | Discharge status: Home / Self Care | Payer: Medicaid Other | Attending: Obstetrics & Gynecology | Admitting: Obstetrics & Gynecology

## 2019-08-26 ENCOUNTER — Encounter (HOSPITAL_COMMUNITY): Payer: Self-pay | Admitting: Obstetrics & Gynecology

## 2019-08-26 ENCOUNTER — Inpatient Hospital Stay (HOSPITAL_BASED_OUTPATIENT_CLINIC_OR_DEPARTMENT_OTHER): Payer: Medicaid Other

## 2019-08-26 DIAGNOSIS — Z8619 Personal history of other infectious and parasitic diseases: Secondary | ICD-10-CM | POA: Insufficient documentation

## 2019-08-26 DIAGNOSIS — Z3686 Encounter for antenatal screening for cervical length: Secondary | ICD-10-CM | POA: Diagnosis not present

## 2019-08-26 DIAGNOSIS — Z3A18 18 weeks gestation of pregnancy: Secondary | ICD-10-CM | POA: Diagnosis not present

## 2019-08-26 DIAGNOSIS — N939 Abnormal uterine and vaginal bleeding, unspecified: Secondary | ICD-10-CM | POA: Diagnosis present

## 2019-08-26 DIAGNOSIS — W009XXA Unspecified fall due to ice and snow, initial encounter: Secondary | ICD-10-CM

## 2019-08-26 DIAGNOSIS — Z6790 Unspecified blood type, Rh positive: Secondary | ICD-10-CM | POA: Diagnosis not present

## 2019-08-26 DIAGNOSIS — R109 Unspecified abdominal pain: Secondary | ICD-10-CM | POA: Insufficient documentation

## 2019-08-26 DIAGNOSIS — O209 Hemorrhage in early pregnancy, unspecified: Secondary | ICD-10-CM | POA: Diagnosis not present

## 2019-08-26 DIAGNOSIS — O4692 Antepartum hemorrhage, unspecified, second trimester: Secondary | ICD-10-CM

## 2019-08-26 DIAGNOSIS — Z87891 Personal history of nicotine dependence: Secondary | ICD-10-CM | POA: Diagnosis not present

## 2019-08-26 DIAGNOSIS — O26892 Other specified pregnancy related conditions, second trimester: Secondary | ICD-10-CM | POA: Diagnosis not present

## 2019-08-26 DIAGNOSIS — W000XXA Fall on same level due to ice and snow, initial encounter: Secondary | ICD-10-CM | POA: Diagnosis not present

## 2019-08-26 LAB — WET PREP, GENITAL
Sperm: NONE SEEN
Trich, Wet Prep: NONE SEEN
Yeast Wet Prep HPF POC: NONE SEEN

## 2019-08-26 NOTE — Discharge Instructions (Signed)
Abdominal Pain During Pregnancy  Abdominal pain is common during pregnancy, and has many possible causes. Some causes are more serious than others, and sometimes the cause is not known. Abdominal pain can be a sign that labor is starting. It can also be caused by normal growth and stretching of muscles and ligaments during pregnancy. Always tell your health care provider if you have any abdominal pain. Follow these instructions at home:  Do not have sex or put anything in your vagina until your pain goes away completely.  Get plenty of rest until your pain improves.  Drink enough fluid to keep your urine pale yellow.  Take over-the-counter and prescription medicines only as told by your health care provider.  Keep all follow-up visits as told by your health care provider. This is important. Contact a health care provider if:  Your pain continues or gets worse after resting.  You have lower abdominal pain that: ? Comes and goes at regular intervals. ? Spreads to your back. ? Is similar to menstrual cramps.  You have pain or burning when you urinate. Get help right away if:  You have a fever or chills.  You have vaginal bleeding.  You are leaking fluid from your vagina.  You are passing tissue from your vagina.  You have vomiting or diarrhea that lasts for more than 24 hours.  Your baby is moving less than usual.  You feel very weak or faint.  You have shortness of breath.  You develop severe pain in your upper abdomen. Summary  Abdominal pain is common during pregnancy, and has many possible causes.  If you experience abdominal pain during pregnancy, tell your health care provider right away.  Follow your health care provider's home care instructions and keep all follow-up visits as directed. This information is not intended to replace advice given to you by your health care provider. Make sure you discuss any questions you have with your health care  provider. Document Revised: 10/11/2018 Document Reviewed: 09/25/2016 Elsevier Patient Education  2020 Elsevier Inc.  

## 2019-08-26 NOTE — MAU Note (Addendum)
Pt states she fell right before she came in. States she fell down the stairs and landed on her stomach. Pt reports she is having some pain in lower abdomen. Was having spotting right after fall but none now that she knows of. Gets [facility].
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
## 2019-08-29 ENCOUNTER — Telehealth: Payer: Self-pay | Admitting: General Practice

## 2019-08-29 LAB — GC/CHLAMYDIA PROBE AMP (~~LOC~~) NOT AT ARMC
Chlamydia: NEGATIVE
Comment: NEGATIVE
Comment: NORMAL
Neisseria Gonorrhea: NEGATIVE
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
## 2019-08-29 NOTE — Telephone Encounter (Signed)
Patient was seen in MAU and requested to transfer care from [facility].  Called patient to schedule.  Pt stated that she wanted to hold off before transferring and would call to schedule at a later time.

## 2019-09-12 ENCOUNTER — Other Ambulatory Visit: Payer: Self-pay

## 2019-09-12 ENCOUNTER — Emergency Department
Admission: EM | Admit: 2019-09-12 | Discharge: 2019-09-12 | Disposition: A | Discharge status: Home / Self Care | Payer: Medicaid Other | Attending: Emergency Medicine | Admitting: Emergency Medicine

## 2019-09-12 ENCOUNTER — Encounter: Payer: Self-pay | Admitting: *Deleted

## 2019-09-12 DIAGNOSIS — Z3A21 21 weeks gestation of pregnancy: Secondary | ICD-10-CM | POA: Diagnosis not present

## 2019-09-12 DIAGNOSIS — Z87891 Personal history of nicotine dependence: Secondary | ICD-10-CM | POA: Diagnosis not present

## 2019-09-12 DIAGNOSIS — K0889 Other specified disorders of teeth and supporting structures: Secondary | ICD-10-CM | POA: Diagnosis not present

## 2019-09-12 DIAGNOSIS — J45909 Unspecified asthma, uncomplicated: Secondary | ICD-10-CM | POA: Insufficient documentation

## 2019-09-12 DIAGNOSIS — O26892 Other specified pregnancy related conditions, second trimester: Secondary | ICD-10-CM | POA: Diagnosis present

## 2019-09-12 DIAGNOSIS — Z79899 Other long term (current) drug therapy: Secondary | ICD-10-CM | POA: Diagnosis not present

## 2019-09-12 MED ORDER — CLINDAMYCIN HCL 150 MG PO CAPS
300.0000 mg | ORAL_CAPSULE | Freq: Once | ORAL | Status: AC
  Administered 2019-09-12: 300 mg via ORAL
  Filled 2019-09-12: qty 2

## 2019-09-12 MED ORDER — CLINDAMYCIN HCL 300 MG PO CAPS: 300.0000 mg | ORAL_CAPSULE | Freq: Three times a day (TID) | ORAL | 0 refills | Status: AC

## 2019-09-12 MED ORDER — ACETAMINOPHEN-CODEINE #3 300-30 MG PO TABS: 1.0000 | ORAL_TABLET | Freq: Three times a day (TID) | ORAL | 0 refills | Status: DC | PRN

## 2019-09-12 NOTE — Discharge Instructions (Signed)
Take the pain medication for severe pain.  Take the antibiotic until finished.  Call and request an appointment with the dentist of your choice.  Return to the ER for symptoms that change or worsen if unable to schedule an appointment.

## 2019-09-12 NOTE — ED Notes (Signed)
Patient states she has been having tooth pain on the left lower side of mouth. Patient states tooth fell out a couple of months ago and think there is some tooth left in socket and is causing other teeth to hurt.
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
## 2019-09-12 NOTE — ED Triage Notes (Signed)
Pt has lower left tooth ache.  Sx for 2 months.  Taking tylenol without relief.    Pt is [redacted] weeks pregnant.  High risk prenatal in [place].  No abd pain.  No vag bleeding.    Pt alert speech clear.

## 2019-09-20 ENCOUNTER — Observation Stay
Admission: EM | Admit: 2019-09-20 | Discharge: 2019-09-20 | Disposition: A | Discharge status: Home / Self Care | Payer: Medicaid Other | Attending: Obstetrics and Gynecology | Admitting: Obstetrics and Gynecology

## 2019-09-20 ENCOUNTER — Other Ambulatory Visit: Payer: Self-pay

## 2019-09-20 ENCOUNTER — Encounter: Payer: Self-pay | Admitting: Obstetrics and Gynecology

## 2019-09-20 DIAGNOSIS — R109 Unspecified abdominal pain: Secondary | ICD-10-CM

## 2019-09-20 DIAGNOSIS — R252 Cramp and spasm: Secondary | ICD-10-CM

## 2019-09-20 DIAGNOSIS — O99891 Other specified diseases and conditions complicating pregnancy: Secondary | ICD-10-CM

## 2019-09-20 DIAGNOSIS — Z3A21 21 weeks gestation of pregnancy: Secondary | ICD-10-CM

## 2019-09-20 DIAGNOSIS — M545 Low back pain: Secondary | ICD-10-CM

## 2019-09-20 DIAGNOSIS — O26892 Other specified pregnancy related conditions, second trimester: Secondary | ICD-10-CM | POA: Diagnosis not present

## 2019-09-20 DIAGNOSIS — R1030 Lower abdominal pain, unspecified: Secondary | ICD-10-CM

## 2019-09-20 LAB — URINALYSIS, ROUTINE W REFLEX MICROSCOPIC
Bacteria, UA: NONE SEEN
Bilirubin Urine: NEGATIVE
Glucose, UA: NEGATIVE mg/dL
Hgb urine dipstick: NEGATIVE
Ketones, ur: NEGATIVE mg/dL
Leukocytes,Ua: NEGATIVE
Nitrite: NEGATIVE
Protein, ur: 30 mg/dL — AB
Specific Gravity, Urine: 1.027 (ref 1.005–1.030)
WBC, UA: NONE SEEN WBC/hpf (ref 0–5)
pH: 7 (ref 5.0–8.0)

## 2019-09-20 LAB — WET PREP, GENITAL
Clue Cells Wet Prep HPF POC: NONE SEEN
Sperm: NONE SEEN
Trich, Wet Prep: NONE SEEN

## 2019-09-20 MED ORDER — OXYCODONE-ACETAMINOPHEN 5-325 MG PO TABS
1.0000 | ORAL_TABLET | Freq: Four times a day (QID) | ORAL | Status: DC | PRN
  Administered 2019-09-20: 1 via ORAL
  Filled 2019-09-20: qty 1

## 2019-09-20 MED ORDER — FLUCONAZOLE 50 MG PO TABS
150.0000 mg | ORAL_TABLET | Freq: Once | ORAL | Status: AC
  Administered 2019-09-20: 150 mg via ORAL
  Filled 2019-09-20: qty 1

## 2019-09-20 NOTE — Progress Notes (Signed)
Pt discharged home per A.Valentino Saxon, MD order. Pt received AVS and discharge instructions. Pt received one dose of 150mg   Diflucan before discharge.  Pt given labor and bleeding precautions and all questioned answered by RN. Pt verbalized understanding. Pt in stable condition, ambulatory, and discharged home in personal vehicle.

## 2019-09-20 NOTE — Final Progress Note (Signed)
L&D OB Triage Note  Jody Gould is a 29 y.o. G7P0060 unassigned female at [redacted]w[redacted]d, EDD Estimated Date of Delivery: 01/26/20 who presented to triage for complaints of 8/10 lower abdominal pain and cramping.  Pain wraps around the back.  Pain has been ongoing since yesterday. Patient reported that she took a Tylenol last night which did not help much.  She was evaluated by the nurses with no urgent or significant findings. Vital signs stable. Fetal heart tones were assessed. She was treated with Percocet which relieved her pain (decreased to 2/10). Paitent of note receives prenatal care in [place] area.    Physical Exam:  Blood pressure 134/79, pulse 81, temperature 98.5 F (36.9 C), temperature source Oral, resp. rate 18, height 5\' 3"  (1.6 m), weight 99.8 kg, last menstrual period 04/21/2019, unknown if currently breastfeeding. Pelvic deferred.   NST INTERPRETATION: Indications: abdominal pain in pregnancy  Mode: External Baseline Rate (A): 155 bpm(fht)       Labs:  Results for orders placed or performed during the hospital encounter of 09/20/19  Wet prep, genital   Specimen: Vaginal  Result Value Ref Range   Yeast Wet Prep HPF POC PRESENT (A) NONE SEEN   Trich, Wet Prep NONE SEEN NONE SEEN   Clue Cells Wet Prep HPF POC NONE SEEN NONE SEEN   WBC, Wet Prep HPF POC RARE (A) NONE SEEN   Sperm NONE SEEN   Urinalysis, Routine w reflex microscopic  Result Value Ref Range   Color, Urine YELLOW (A) YELLOW   APPearance CLEAR (A) CLEAR   Specific Gravity, Urine 1.027 1.005 - 1.030   pH 7.0 5.0 - 8.0   Glucose, UA NEGATIVE NEGATIVE mg/dL   Hgb urine dipstick NEGATIVE NEGATIVE   Bilirubin Urine NEGATIVE NEGATIVE   Ketones, ur NEGATIVE NEGATIVE mg/dL   Protein, ur 30 (A) NEGATIVE mg/dL   Nitrite NEGATIVE NEGATIVE   Leukocytes,Ua NEGATIVE NEGATIVE   WBC, UA NONE SEEN 0 - 5 WBC/hpf   Bacteria, UA NONE SEEN NONE SEEN   Squamous Epithelial / LPF 6-10 0 - 5   Mucus PRESENT    Amorphous Crystal PRESENT      Plan: NST performed was reviewed and was found to be reactive. Unclear cause of significant abdominal pain, but patient responded well to pain medication. Yeast noted on wet prep, given treatment of 150 mg of Diflucan in triage. She was discharged home with bleeding/PTL precautions.  Continue routine prenatal care. Follow up with OB/GYN as previously scheduled.     09/22/19, MD  [facility]

## 2019-09-20 NOTE — OB Triage Note (Signed)
Pt is a 29 y/o [redacted]w[redacted]d G7P0060 and presents to the ED c/o abdominal pain. Pt states she is having bilateral lower abdominal pain/ constant cramping and rates it 8/10 that wraps around to the back and rates pain 8/10. Pt denies leaking of fluid or vaginal bleeding. Pt states she took tylenol last night and percocet last week due to her tooth ache. Pt reports use of marijuana but states she quit "about a month ago." Doppler 162. External monitors applied and assessing. VSS.

## 2020-03-20 ENCOUNTER — Other Ambulatory Visit: Payer: Self-pay

## 2020-03-20 ENCOUNTER — Encounter: Payer: Self-pay | Admitting: *Deleted

## 2020-03-20 ENCOUNTER — Emergency Department
Admission: EM | Admit: 2020-03-20 | Discharge: 2020-03-20 | Disposition: A | Discharge status: Home / Self Care | Payer: Medicaid Other | Attending: Student in an Organized Health Care Education/Training Program | Admitting: Student in an Organized Health Care Education/Training Program

## 2020-03-20 DIAGNOSIS — Z87891 Personal history of nicotine dependence: Secondary | ICD-10-CM | POA: Insufficient documentation

## 2020-03-20 DIAGNOSIS — J45909 Unspecified asthma, uncomplicated: Secondary | ICD-10-CM | POA: Insufficient documentation

## 2020-03-20 DIAGNOSIS — Z7982 Long term (current) use of aspirin: Secondary | ICD-10-CM | POA: Insufficient documentation

## 2020-03-20 DIAGNOSIS — K047 Periapical abscess without sinus: Secondary | ICD-10-CM | POA: Diagnosis present

## 2020-03-20 DIAGNOSIS — L0291 Cutaneous abscess, unspecified: Secondary | ICD-10-CM

## 2020-03-20 MED ORDER — BENZOCAINE 10 % MT GEL
Freq: Once | OROMUCOSAL | Status: AC
  Filled 2020-03-20: qty 9

## 2020-03-20 MED ORDER — LIDOCAINE VISCOUS HCL 2 % MT SOLN
15.0000 mL | Freq: Once | OROMUCOSAL | Status: AC
  Administered 2020-03-20: 15 mL via OROMUCOSAL
  Filled 2020-03-20: qty 15

## 2020-03-20 MED ORDER — AMOXICILLIN 500 MG PO CAPS: 500.0000 mg | ORAL_CAPSULE | Freq: Three times a day (TID) | ORAL | 0 refills | Status: DC

## 2020-03-20 MED ORDER — LIDOCAINE VISCOUS HCL 2 % MT SOLN: 10.0000 mL | OROMUCOSAL | 0 refills | Status: DC | PRN

## 2020-03-20 MED ORDER — AMOXICILLIN 500 MG PO CAPS
500.0000 mg | ORAL_CAPSULE | Freq: Once | ORAL | Status: AC
  Administered 2020-03-20: 500 mg via ORAL
  Filled 2020-03-20: qty 1

## 2020-03-20 MED ORDER — LIDOCAINE-EPINEPHRINE-TETRACAINE (LET) TOPICAL GEL: 3.0000 mL | Freq: Once | TOPICAL | Status: DC
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
## 2020-03-20 NOTE — ED Triage Notes (Signed)
Pt has abscess in left lower mouth since yesterday.  No drainage.  Pt taking tylenol without relief.  Pt alert speech clear.

## 2021-11-08 IMAGING — US US MFM OB LIMITED
1 series · 15 of 28 positions shown · non-contrast
Comparison: none

[Series 1: us mfm ob limited · 32 acquisitions, 15 frames shown]
[im 1/32]
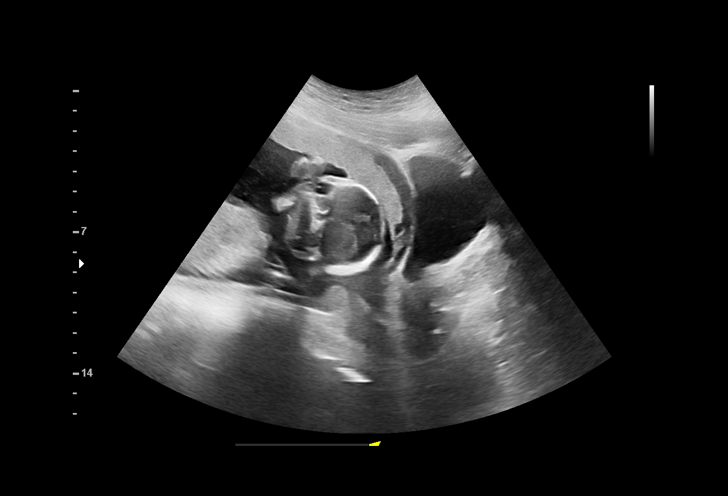
[im 3/32]
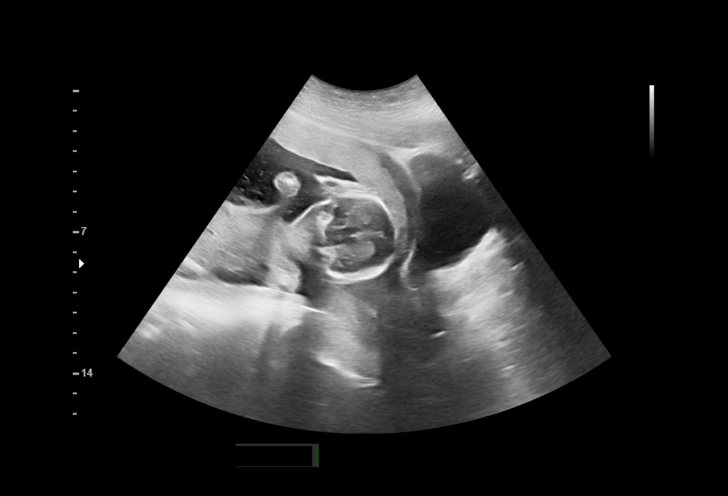
[im 5/32]
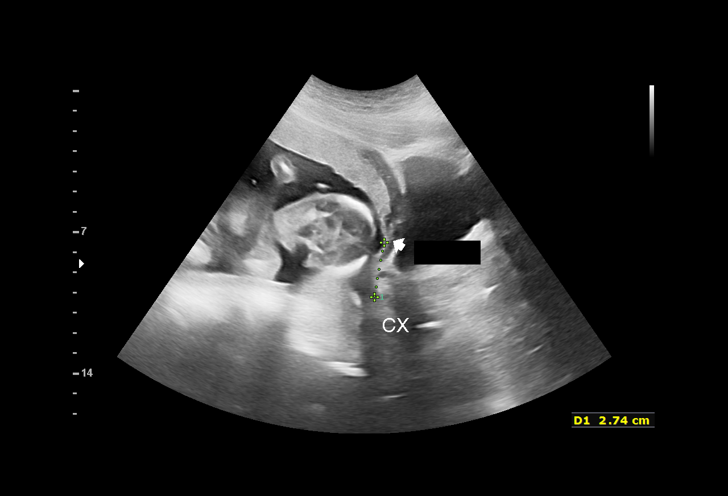
[im 7/32]
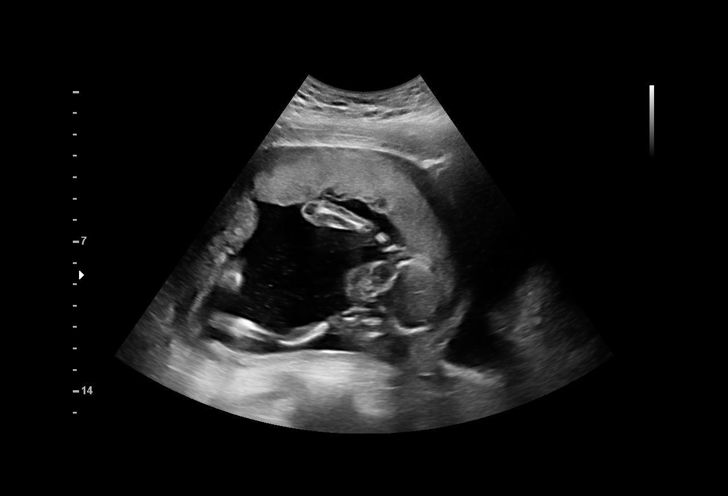
[im 10/32]
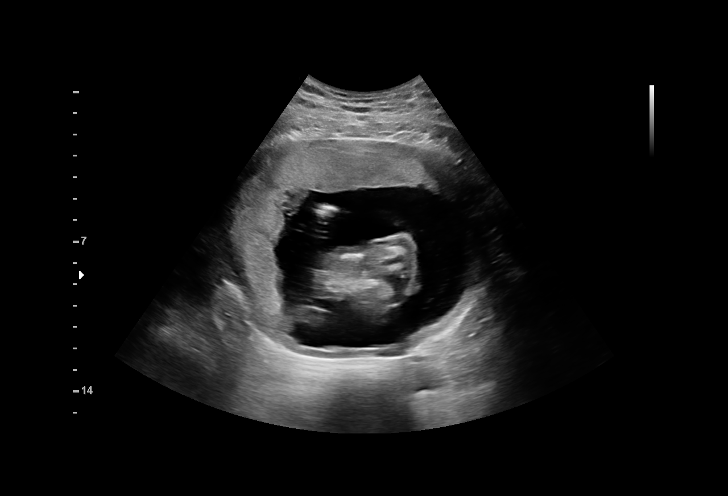
[im 12/32]
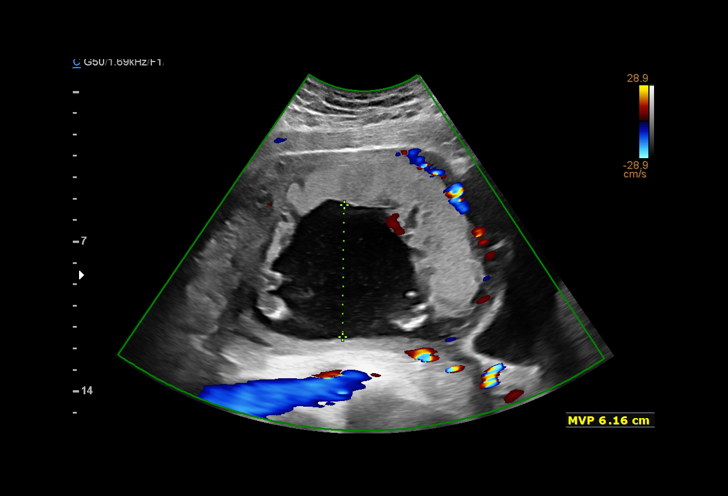
[im 14/32]
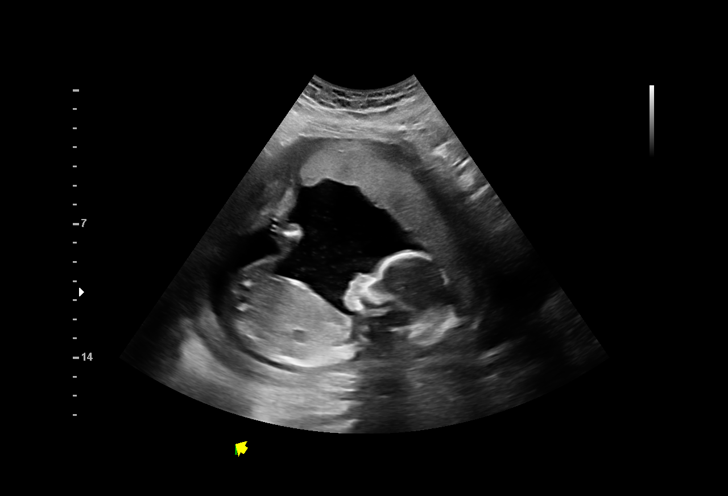
[im 17/32]
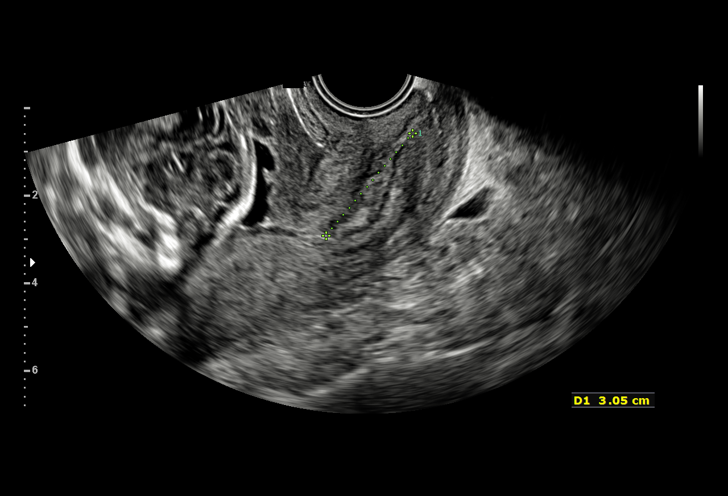
[im 18/32]
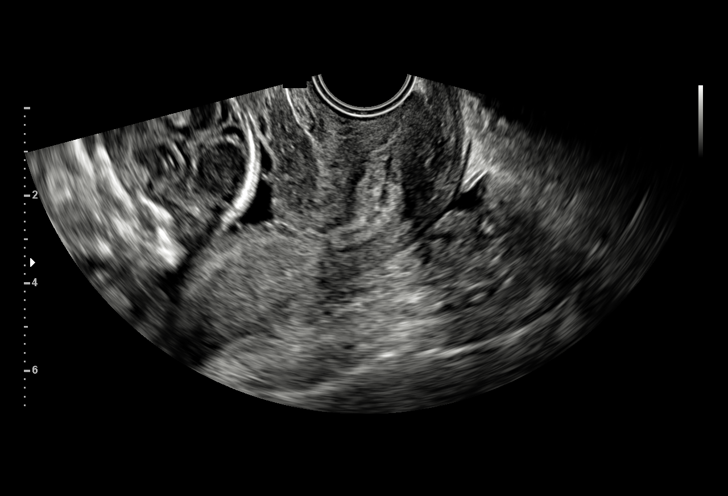
[im 20/32]
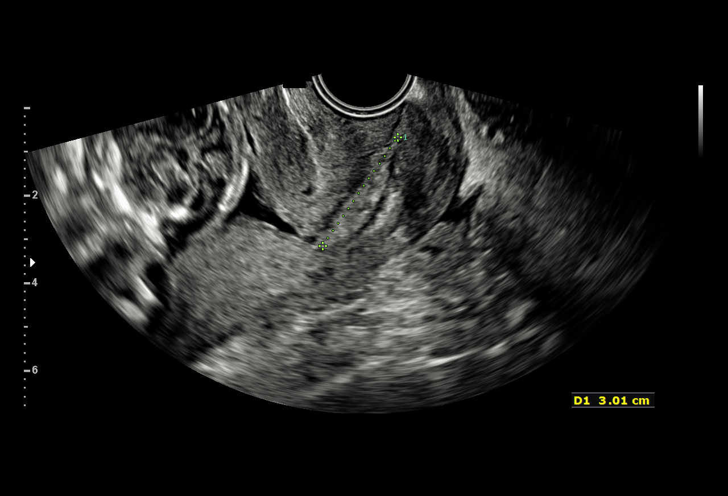
[im 22/32]
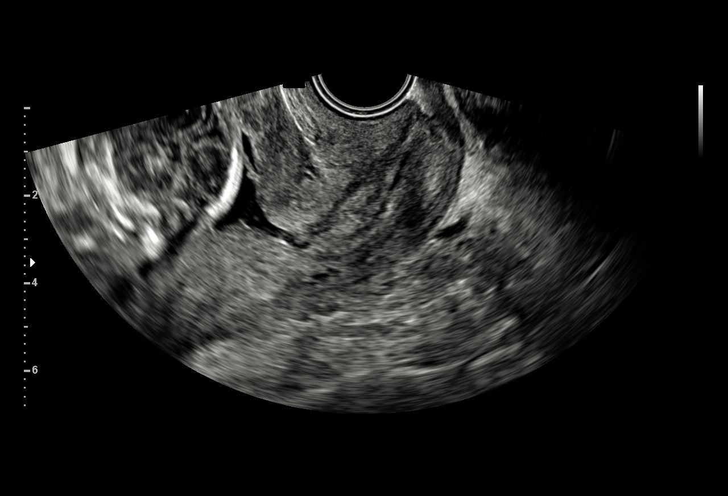
[im 25/32]
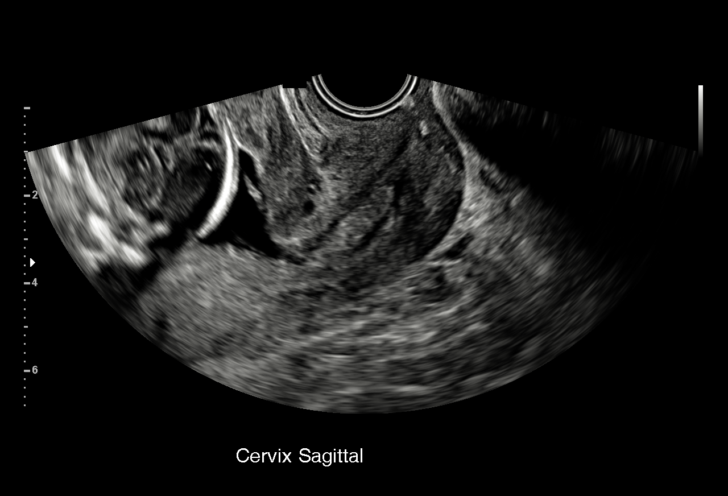
[im 27/32]
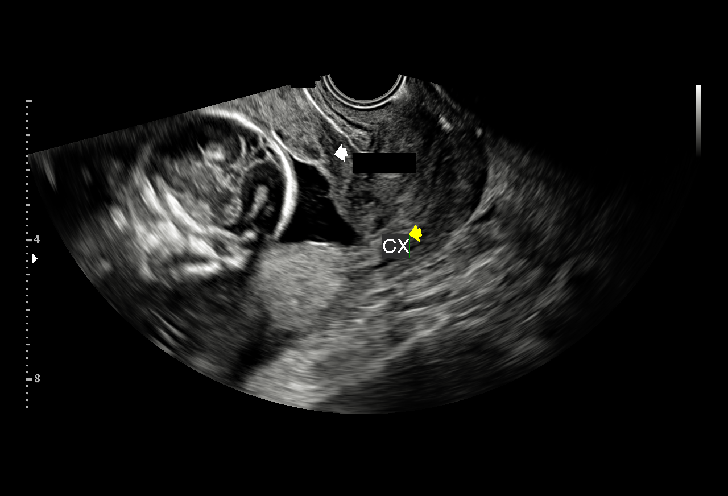
[im 29/32]
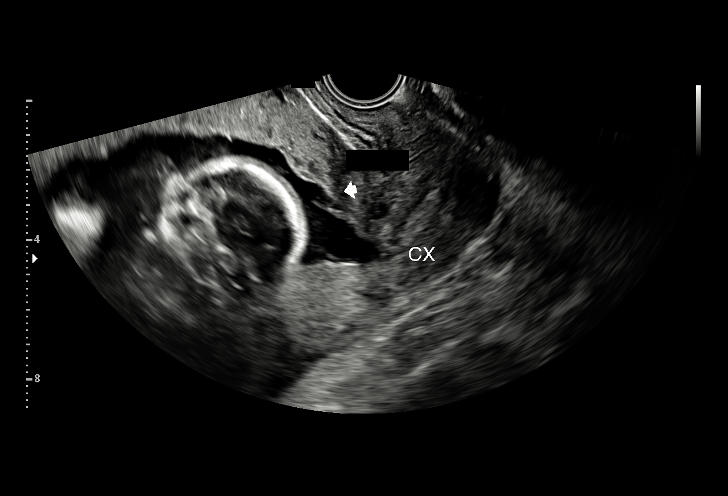
[im 32/32]
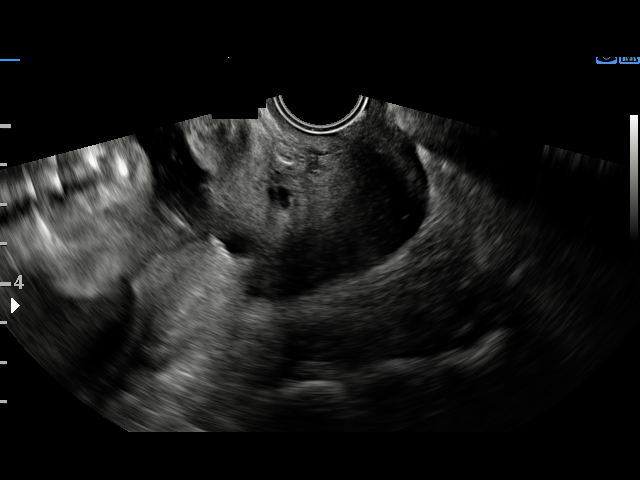

[15 of 28 positions shown; findings below may reference images not displayed]

----------------------------------------------------------------------

 ----------------------------------------------------------------------
Indications

  Abdominal pain in pregnancy
  Traumatic injury during pregnancy (Pt fell
  and landed on stomach)
  Vaginal bleeding in pregnancy, second
  trimester (Spotting after fall, none currently)
  18 weeks gestation of pregnancy
  Obesity complicating pregnancy, second
  trimester
  Encounter for cervical length
 ----------------------------------------------------------------------
Fetal Evaluation

 Num Of Fetuses:          1
 Fetal Heart Rate(bpm):   159
 Cardiac Activity:        Observed
 Presentation:            Cephalic
 Placenta:                Anterior

 Amniotic Fluid
 AFI FV:      Within normal limits

                             Largest Pocket(cm)

OB History

 Gravidity:    7          SAB:   6
Gestational Age

 LMP:           18w 1d        Date:  04/21/19                 EDD:   01/26/20
 Best:          18w 1d     Det. By:  LMP  (04/21/19)          EDD:   01/26/20
Cervix Uterus Adnexa

 Cervix
 Length:            3.3  cm.
 Measured transvaginally. Appears closed, without funnelling.
Impression

 Limited exam
 Bleeding in the second trimester secondary to maternal fall
 Cervical length is normal  with good amniotic fluid volume.
Recommendations

 Follow up anatomy if not previoulsy performed at first
 available appointment.

## 2022-04-14 ENCOUNTER — Inpatient Hospital Stay (HOSPITAL_COMMUNITY)
Admission: AD | Admit: 2022-04-14 | Discharge: 2022-04-14 | Disposition: A | Discharge status: Home / Self Care | Payer: Medicaid Other | Attending: Family Medicine | Admitting: Family Medicine

## 2022-04-14 ENCOUNTER — Inpatient Hospital Stay (HOSPITAL_COMMUNITY): Payer: Medicaid Other

## 2022-04-14 ENCOUNTER — Encounter (HOSPITAL_COMMUNITY): Payer: Self-pay | Admitting: Family Medicine

## 2022-04-14 ENCOUNTER — Other Ambulatory Visit: Payer: Self-pay

## 2022-04-14 DIAGNOSIS — Z3A00 Weeks of gestation of pregnancy not specified: Secondary | ICD-10-CM | POA: Diagnosis not present

## 2022-04-14 DIAGNOSIS — R109 Unspecified abdominal pain: Secondary | ICD-10-CM | POA: Diagnosis present

## 2022-04-14 DIAGNOSIS — O26891 Other specified pregnancy related conditions, first trimester: Secondary | ICD-10-CM | POA: Insufficient documentation

## 2022-04-14 DIAGNOSIS — O209 Hemorrhage in early pregnancy, unspecified: Secondary | ICD-10-CM | POA: Diagnosis not present

## 2022-04-14 LAB — URINALYSIS, ROUTINE W REFLEX MICROSCOPIC
Bilirubin Urine: NEGATIVE
Glucose, UA: NEGATIVE mg/dL
Ketones, ur: 5 mg/dL — AB
Leukocytes,Ua: NEGATIVE
Nitrite: NEGATIVE
Protein, ur: 100 mg/dL — AB
RBC / HPF: >50 RBC/hpf — ABNORMAL HIGH (ref 0–5)
Specific Gravity, Urine: 1.02 (ref 1.005–1.030)
pH: 6 (ref 5.0–8.0)

## 2022-04-14 LAB — CBC
HCT: 34.1 % — ABNORMAL LOW (ref 36.0–46.0)
Hemoglobin: 12.8 g/dL (ref 12.0–15.0)
MCH: 29.6 pg (ref 26.0–34.0)
MCHC: 37.5 g/dL — ABNORMAL HIGH (ref 30.0–36.0)
MCV: 78.8 fL — ABNORMAL LOW (ref 80.0–100.0)
Platelets: 247 10*3/uL (ref 150–400)
RBC: 4.33 MIL/uL (ref 3.87–5.11)
RDW: 13.2 % (ref 11.5–15.5)
WBC: 7.3 10*3/uL (ref 4.0–10.5)
nRBC: 0 % (ref 0.0–0.2)

## 2022-04-14 LAB — POCT PREGNANCY, URINE: Preg Test, Ur: POSITIVE — AB

## 2022-04-14 LAB — WET PREP, GENITAL
Clue Cells Wet Prep HPF POC: NONE SEEN
Sperm: NONE SEEN
Trich, Wet Prep: NONE SEEN
WBC, Wet Prep HPF POC: <10 (ref <10)
Yeast Wet Prep HPF POC: NONE SEEN

## 2022-04-14 LAB — HCG, QUANTITATIVE, PREGNANCY: hCG, Beta Chain, Quant, S: 1965 m[IU]/mL — ABNORMAL HIGH (ref <5)
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
## 2022-04-14 NOTE — MAU Note (Signed)
Jody Gould is a 31 y.o. at Unknown here in MAU reporting: lower abdominal and back pain.  Also reports having VB that began yesterday, but VB increased and passing clots today.  States wearing sanitary napkin.  Took Ibuprofen this morning @ 0700 LMP: 01/21/2022 Onset of complaint: yesterday Pain score: 8 Vitals:   04/14/22 1205  BP: 138/83  Pulse: 70  Resp: 18  Temp: 98.4 F (36.9 C)  SpO2: 100%     GNO:[id number] Lab orders placed from triage:   UPT & UA

## 2022-04-15 ENCOUNTER — Inpatient Hospital Stay (HOSPITAL_COMMUNITY): Payer: Medicaid Other

## 2022-04-15 ENCOUNTER — Encounter (HOSPITAL_COMMUNITY): Payer: Self-pay | Admitting: Obstetrics & Gynecology

## 2022-04-15 ENCOUNTER — Inpatient Hospital Stay (HOSPITAL_COMMUNITY)
Admission: AD | Admit: 2022-04-15 | Discharge: 2022-04-15 | Disposition: A | Discharge status: Home / Self Care | Payer: Medicaid Other | Attending: Obstetrics & Gynecology | Admitting: Obstetrics & Gynecology

## 2022-04-15 DIAGNOSIS — O039 Complete or unspecified spontaneous abortion without complication: Secondary | ICD-10-CM

## 2022-04-15 DIAGNOSIS — O034 Incomplete spontaneous abortion without complication: Secondary | ICD-10-CM | POA: Diagnosis not present

## 2022-04-15 DIAGNOSIS — Z6790 Unspecified blood type, Rh positive: Secondary | ICD-10-CM

## 2022-04-15 DIAGNOSIS — O26891 Other specified pregnancy related conditions, first trimester: Secondary | ICD-10-CM | POA: Diagnosis present

## 2022-04-15 LAB — CBC
HCT: 36.5 % (ref 36.0–46.0)
Hemoglobin: 13.4 g/dL (ref 12.0–15.0)
MCH: 29.1 pg (ref 26.0–34.0)
MCHC: 36.7 g/dL — ABNORMAL HIGH (ref 30.0–36.0)
MCV: 79.3 fL — ABNORMAL LOW (ref 80.0–100.0)
Platelets: 268 10*3/uL (ref 150–400)
RBC: 4.6 MIL/uL (ref 3.87–5.11)
RDW: 13.3 % (ref 11.5–15.5)
WBC: 7.7 10*3/uL (ref 4.0–10.5)
nRBC: 0 % (ref 0.0–0.2)

## 2022-04-15 LAB — BASIC METABOLIC PANEL
Anion gap: 8 (ref 5–15)
BUN: 9 mg/dL (ref 6–20)
CO2: 23 mmol/L (ref 22–32)
Calcium: 9.3 mg/dL (ref 8.9–10.3)
Chloride: 108 mmol/L (ref 98–111)
Creatinine, Ser: 0.71 mg/dL (ref 0.44–1.00)
GFR, Estimated: >60 mL/min (ref >60)
Glucose, Bld: 114 mg/dL — ABNORMAL HIGH (ref 70–99)
Potassium: 4.1 mmol/L (ref 3.5–5.1)
Sodium: 139 mmol/L (ref 135–145)

## 2022-04-15 LAB — GC/CHLAMYDIA PROBE AMP (~~LOC~~) NOT AT ARMC
Chlamydia: NEGATIVE
Comment: NEGATIVE
Comment: NORMAL
Neisseria Gonorrhea: NEGATIVE

## 2022-04-15 LAB — HCG, QUANTITATIVE, PREGNANCY: hCG, Beta Chain, Quant, S: 811 m[IU]/mL — ABNORMAL HIGH (ref <5)

## 2022-04-15 MED ORDER — ONDANSETRON HCL 4 MG/2ML IJ SOLN
4.0000 mg | Freq: Once | INTRAMUSCULAR | Status: AC
  Administered 2022-04-15: 4 mg via INTRAVENOUS

## 2022-04-15 MED ORDER — KETOROLAC TROMETHAMINE 10 MG PO TABS: 10.0000 mg | ORAL_TABLET | Freq: Four times a day (QID) | ORAL | 0 refills | Status: DC | PRN

## 2022-04-15 MED ORDER — HYDROMORPHONE HCL 1 MG/ML IJ SOLN
1.0000 mg | Freq: Once | INTRAMUSCULAR | Status: AC
  Administered 2022-04-15: 1 mg via INTRAVENOUS
  Filled 2022-04-15: qty 1

## 2022-04-15 MED ORDER — LACTATED RINGERS IV BOLUS
1000.0000 mL | Freq: Once | INTRAVENOUS | Status: AC
  Administered 2022-04-15: 1000 mL via INTRAVENOUS

## 2022-04-15 MED ORDER — ONDANSETRON HCL 4 MG/2ML IJ SOLN
INTRAMUSCULAR | Status: AC
  Filled 2022-04-15: qty 2
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
## 2022-04-15 NOTE — MAU Note (Signed)
Patient reports her pain is substantially better and rates it a 3/10 from a previous 8/10 upon arrival to MAU. Patient no longer nauseous. Patient up to restroom and ambulated on her own as well as dressed on her own. Small amount of blood noted in toilet after patient urinated. No blood clots. Patient stable at time of discharge.

## 2022-04-15 NOTE — MAU Note (Signed)
Ultrasonographer at bedside. 

## 2022-04-15 NOTE — MAU Note (Addendum)
...  APRIL COLTER is a 31 y.o. at approximately [redacted]w[redacted]d according to LMP here in MAU via EMS reporting: Vaginal bleeding since yesterday that increased around 0400 this morning as well as new onset mid lower abdominal pain as well as mid lower back pain. She reports this pain is constant. She reports around 0400 when her bleeding increased, she began saturating maxi pads and would change one pad each hour. She reports around 0700 she began feeling nauseous and starting vomiting. She reports 3 episodes of emesis since 0700.  Patient reports severe nausea upon arrival to MAU and actively vomiting.   Last took Ibuprofen yesterday morning around 0800.  Onset of complaint: This morning at 0400 for pain and increased vaginal bleeding. 0700 for nausea and vomiting.  Pain score:  10/10 mid lower abdomen 10/10 lower back  Vitals:   04/15/22 1131  BP: 135/87  Pulse: 64  Resp: 20  Temp: 99 F (37.2 C)  SpO2: 99%

## 2022-04-16 ENCOUNTER — Ambulatory Visit: Payer: Medicaid Other

## 2022-04-19 LAB — SURGICAL PATHOLOGY

## 2022-04-22 ENCOUNTER — Ambulatory Visit: Payer: Medicaid Other | Admitting: Adult Health

## 2022-05-01 ENCOUNTER — Telehealth (HOSPITAL_BASED_OUTPATIENT_CLINIC_OR_DEPARTMENT_OTHER): Payer: Self-pay | Admitting: *Deleted

## 2022-05-01 NOTE — Telephone Encounter (Signed)
-----   Message from Joycelyn Schmid sent at 05/01/2022  9:07 AM EDT ----- Regarding: FW: SAB appt  ----- Message ----- From: Primitivo Gauze Sent: 04/15/2022   4:10 PM EDT To: Bobetta Lime, CMA; # Subject: FW: SAB appt                                   Hey everyone, this patient needs a provider follow up in one but we do not have anything available at all. Is there any office that could help please? She does have insurance.  Thank you in advance :)  ----- Message ----- From: Julianne Handler, CNM Sent: 04/15/2022   1:56 PM EDT To: [facility] Subject: SAB appt                                       Seen in MAU for SAB. Needs f/u in 1 week. Thanks!

## 2022-05-01 NOTE — Telephone Encounter (Signed)
Attempted to call pt to offer appt for SAB follow up. Unable to leave message, voicemail not set up.

## 2022-11-26 ENCOUNTER — Ambulatory Visit: Payer: Medicaid Other

## 2022-12-16 ENCOUNTER — Ambulatory Visit: Payer: Medicaid Other

## 2022-12-17 ENCOUNTER — Ambulatory Visit: Payer: Medicaid Other

## 2023-05-23 ENCOUNTER — Emergency Department (HOSPITAL_BASED_OUTPATIENT_CLINIC_OR_DEPARTMENT_OTHER)
Admission: EM | Admit: 2023-05-23 | Discharge: 2023-05-23 | Disposition: A | Discharge status: Home / Self Care | Payer: Medicaid Other | Attending: Emergency Medicine | Admitting: Emergency Medicine

## 2023-05-23 ENCOUNTER — Encounter (HOSPITAL_BASED_OUTPATIENT_CLINIC_OR_DEPARTMENT_OTHER): Payer: Self-pay

## 2023-05-23 DIAGNOSIS — O99611 Diseases of the digestive system complicating pregnancy, first trimester: Secondary | ICD-10-CM | POA: Diagnosis present

## 2023-05-23 DIAGNOSIS — K0889 Other specified disorders of teeth and supporting structures: Secondary | ICD-10-CM | POA: Diagnosis not present

## 2023-05-23 DIAGNOSIS — J45909 Unspecified asthma, uncomplicated: Secondary | ICD-10-CM | POA: Diagnosis not present

## 2023-05-23 MED ORDER — LIDOCAINE VISCOUS HCL 2 % MT SOLN: 5.0000 mL | Freq: Three times a day (TID) | OROMUCOSAL | 0 refills | Status: DC | PRN

## 2023-05-23 MED ORDER — LIDOCAINE VISCOUS HCL 2 % MT SOLN
15.0000 mL | Freq: Once | OROMUCOSAL | Status: AC
  Administered 2023-05-23: 15 mL via OROMUCOSAL
  Filled 2023-05-23: qty 15

## 2023-05-23 NOTE — ED Provider Notes (Signed)
[place] EMERGENCY DEPARTMENT AT [facility] Provider Note   CSN: [id number] Arrival date & time: 05/23/23  4098     History  Chief Complaint  Patient presents with   Dental Pain    Jody Gould is a 32 y.o. female.  With history of asthma presenting to the ED for evaluation of right maxillary tooth pain.  Symptoms have been present for the past 3 days.  She reports she fractured her right upper tooth approximately 1 month ago while eating something.  She called to schedule a dentist appointment but cannot get in until 5 days from now.  She is 7 months pregnant.  She has been using Tylenol with minimal improvement in her symptoms.  She is also been using Orajel.  She reports pain with chewing.  Denies any difficulty swallowing, fevers, drainage, sore throat.   Dental Pain      Home Medications Prior to Admission medications   Medication Sig Start Date End Date Taking? Authorizing Provider  magic mouthwash (lidocaine, diphenhydrAMINE, alum & mag hydroxide) suspension Swish and spit 5 mLs 3 (three) times daily as needed for mouth pain. 05/23/23  Yes Calyb Mcquarrie, Edsel Petrin, PA-C  ketorolac (TORADOL) 10 MG tablet Take 1 tablet (10 mg total) by mouth every 6 (six) hours as needed for moderate pain. 04/15/22   Donette Larry, CNM      Allergies    Patient has no known allergies.    Review of Systems   Review of Systems  HENT:  Positive for dental problem.   All other systems reviewed and are negative.   Physical Exam Updated Vital Signs BP 117/78 (BP Location: Right Arm)   Pulse 79   Temp 98 F (36.7 C) (Oral)   Resp 18   SpO2 100%  Physical Exam Vitals and nursing note reviewed.  Constitutional:      General: She is not in acute distress.    Appearance: Normal appearance. She is normal weight. She is not ill-appearing.  HENT:     Head: Normocephalic and atraumatic.     Mouth/Throat:     Mouth: Mucous membranes are moist.     Pharynx: Oropharynx is clear.       Comments: Large dental fracture.  Uvula midline.  Tonsils 0 bilaterally.  No erythema.  No exudates.  No submandibular swelling or induration.  No drooling, trismus or tripoding. Pulmonary:     Effort: Pulmonary effort is normal. No respiratory distress.  Abdominal:     General: Abdomen is flat.  Musculoskeletal:        General: Normal range of motion.     Cervical back: Neck supple.  Skin:    General: Skin is warm and dry.  Neurological:     Mental Status: She is alert and oriented to person, place, and time.  Psychiatric:        Mood and Affect: Mood normal.        Behavior: Behavior normal.     ED Results / Procedures / Treatments   Labs (all labs ordered are listed, but only abnormal results are displayed) Labs Reviewed - No data to display  EKG None  Radiology No results found.  Procedures Procedures    Medications Ordered in ED Medications  lidocaine (XYLOCAINE) 2 % viscous mouth solution 15 mL (has no administration in time range)    ED Course/ Medical Decision Making/ A&P  Medical Decision Making This patient presents to the ED for concern of dental pain, this involves an extensive number of treatment options, and is a complaint that carries with it a high risk of complications and morbidity.  The differential diagnosis includes periapical or other periodontal abscess, pulpitis, dental fracture, Ludwig's angina  My initial workup includes symptom control  Additional history obtained from: Nursing notes from this visit.  Afebrile, hemodynamically stable.  32 year old female presenting for evaluation of right upper dental pain.  She fractured her tooth 1 month ago.  Pain has been present for 3 days.  No signs or symptoms of infection.  On exam, there is a large dental fracture to the right maxillary tooth.  I discussed treatment options with the patient.  Will defer NSAID and opioid therapy due to pregnancy status.  She was  prescribed viscous lidocaine for her symptoms.  She was encouraged to call her dentist on Monday to see if she can get a sooner appointment.  No evidence of Ludwig's angina or infection on exam.  She was given return precautions.  Stable at discharge.  At this time there does not appear to be any evidence of an acute emergency medical condition and the patient appears stable for discharge with appropriate outpatient follow up. Diagnosis was discussed with patient who verbalizes understanding of care plan and is agreeable to discharge. I have discussed return precautions with patient who verbalizes understanding. Patient encouraged to follow-up with their PCP within 1 week. All questions answered.  Note: Portions of this report may have been transcribed using voice recognition software. Every effort was made to ensure accuracy; however, inadvertent computerized transcription errors may still be present.        Final Clinical Impression(s) / ED Diagnoses Final diagnoses:  Pain, dental    Rx / DC Orders ED Discharge Orders          Ordered    magic mouthwash (lidocaine, diphenhydrAMINE, alum & mag hydroxide) suspension  3 times daily PRN        05/23/23 1050              Michelle Piper, PA-C 05/23/23 1054    Benjiman Core, MD 05/23/23 1514

## 2023-05-23 NOTE — ED Triage Notes (Signed)
She c/o right rear upper toothache x ~2 weeks. She states she is aware that the tooth is "cracked".

## 2023-05-23 NOTE — Discharge Instructions (Signed)
You have been seen today for your complaint of dental pain. Your discharge medications include Magic mouthwash.  This is a medicine used to help with your pain.  Swish this medicine around in your mouth and then spit it out.  Do not swallow the medicine. Follow up with: Your dentist as scheduled or sooner if possible Please seek immediate medical care if you develop any of the following symptoms:  At this time there does not appear to be the presence of an emergent medical condition, however there is always the potential for conditions to change. Please read and follow the below instructions.  Do not take your medicine if  develop an itchy rash, swelling in your mouth or lips, or difficulty breathing; call 911 and seek immediate emergency medical attention if this occurs.  You may review your lab tests and imaging results in their entirety on your MyChart account.  Please discuss all results of fully with your primary care provider and other specialist at your follow-up visit.  Note: Portions of this text may have been transcribed using voice recognition software. Every effort was made to ensure accuracy; however, inadvertent computerized transcription errors may still be present.

## 2023-05-24 ENCOUNTER — Telehealth (HOSPITAL_BASED_OUTPATIENT_CLINIC_OR_DEPARTMENT_OTHER): Payer: Self-pay | Admitting: Emergency Medicine

## 2023-05-24 MED ORDER — MAGIC MOUTHWASH: 5.0000 mL | Freq: Four times a day (QID) | ORAL | 0 refills | Status: DC | PRN

## 2023-11-14 ENCOUNTER — Other Ambulatory Visit: Payer: Self-pay

## 2023-11-14 ENCOUNTER — Emergency Department (HOSPITAL_COMMUNITY)

## 2023-11-14 ENCOUNTER — Emergency Department (HOSPITAL_COMMUNITY): Admission: EM | Admit: 2023-11-14 | Discharge: 2023-11-14 | Disposition: A | Discharge status: Home / Self Care

## 2023-11-14 ENCOUNTER — Encounter (HOSPITAL_COMMUNITY): Payer: Self-pay

## 2023-11-14 DIAGNOSIS — N390 Urinary tract infection, site not specified: Secondary | ICD-10-CM | POA: Insufficient documentation

## 2023-11-14 DIAGNOSIS — N939 Abnormal uterine and vaginal bleeding, unspecified: Secondary | ICD-10-CM | POA: Insufficient documentation

## 2023-11-14 DIAGNOSIS — F17200 Nicotine dependence, unspecified, uncomplicated: Secondary | ICD-10-CM | POA: Diagnosis not present

## 2023-11-14 DIAGNOSIS — R1030 Lower abdominal pain, unspecified: Secondary | ICD-10-CM | POA: Diagnosis present

## 2023-11-14 LAB — CBC WITH DIFFERENTIAL/PLATELET
Abs Immature Granulocytes: 0.03 10*3/uL (ref 0.00–0.07)
Basophils Absolute: 0 10*3/uL (ref 0.0–0.1)
Basophils Relative: 0 %
Eosinophils Absolute: 0.1 10*3/uL (ref 0.0–0.5)
Eosinophils Relative: 2 %
HCT: 37.7 % (ref 36.0–46.0)
Hemoglobin: 13.4 g/dL (ref 12.0–15.0)
Immature Granulocytes: 0 %
Lymphocytes Relative: 10 %
Lymphs Abs: 0.8 10*3/uL (ref 0.7–4.0)
MCH: 27.8 pg (ref 26.0–34.0)
MCHC: 35.5 g/dL (ref 30.0–36.0)
MCV: 78.2 fL — ABNORMAL LOW (ref 80.0–100.0)
Monocytes Absolute: 0.2 10*3/uL (ref 0.1–1.0)
Monocytes Relative: 3 %
Neutro Abs: 6.8 10*3/uL (ref 1.7–7.7)
Neutrophils Relative %: 85 %
Platelets: 226 10*3/uL (ref 150–400)
RBC: 4.82 MIL/uL (ref 3.87–5.11)
RDW: 15.9 % — ABNORMAL HIGH (ref 11.5–15.5)
WBC: 8 10*3/uL (ref 4.0–10.5)
nRBC: 0 % (ref 0.0–0.2)

## 2023-11-14 LAB — WET PREP, GENITAL
Clue Cells Wet Prep HPF POC: NONE SEEN
Sperm: NONE SEEN
Trich, Wet Prep: NONE SEEN
WBC, Wet Prep HPF POC: <10 (ref <10)
Yeast Wet Prep HPF POC: NONE SEEN

## 2023-11-14 LAB — COMPREHENSIVE METABOLIC PANEL WITH GFR
ALT: 11 U/L (ref 0–44)
AST: 14 U/L — ABNORMAL LOW (ref 15–41)
Albumin: 4.1 g/dL (ref 3.5–5.0)
Alkaline Phosphatase: 47 U/L (ref 38–126)
Anion gap: 5 (ref 5–15)
BUN: 11 mg/dL (ref 6–20)
CO2: 27 mmol/L (ref 22–32)
Calcium: 9.1 mg/dL (ref 8.9–10.3)
Chloride: 106 mmol/L (ref 98–111)
Creatinine, Ser: 0.61 mg/dL (ref 0.44–1.00)
GFR, Estimated: >60 mL/min (ref >60)
Glucose, Bld: 112 mg/dL — ABNORMAL HIGH (ref 70–99)
Potassium: 4 mmol/L (ref 3.5–5.1)
Sodium: 138 mmol/L (ref 135–145)
Total Bilirubin: 1 mg/dL (ref 0.0–1.2)
Total Protein: 7.1 g/dL (ref 6.5–8.1)

## 2023-11-14 LAB — URINALYSIS, ROUTINE W REFLEX MICROSCOPIC
Bacteria, UA: NONE SEEN
Bilirubin Urine: NEGATIVE
Glucose, UA: NEGATIVE mg/dL
Ketones, ur: 20 mg/dL — AB
Nitrite: NEGATIVE
Protein, ur: 100 mg/dL — AB
RBC / HPF: >50 RBC/hpf (ref 0–5)
Specific Gravity, Urine: 1.024 (ref 1.005–1.030)
WBC, UA: >50 WBC/hpf (ref 0–5)
pH: 7 (ref 5.0–8.0)

## 2023-11-14 LAB — HCG, QUANTITATIVE, PREGNANCY: hCG, Beta Chain, Quant, S: 5 m[IU]/mL — ABNORMAL HIGH (ref <5)

## 2023-11-14 MED ORDER — CEPHALEXIN 500 MG PO CAPS: 500.0000 mg | ORAL_CAPSULE | Freq: Four times a day (QID) | ORAL | 0 refills | Status: AC

## 2023-11-14 MED ORDER — ONDANSETRON 4 MG PO TBDP: 4.0000 mg | ORAL_TABLET | Freq: Three times a day (TID) | ORAL | 0 refills | Status: AC | PRN

## 2023-11-14 MED ORDER — ONDANSETRON 4 MG PO TBDP
4.0000 mg | ORAL_TABLET | Freq: Once | ORAL | Status: AC
  Administered 2023-11-14: 4 mg via ORAL
  Filled 2023-11-14: qty 1

## 2023-11-14 NOTE — ED Provider Notes (Signed)
  EMERGENCY DEPARTMENT AT [facility] Provider Note   CSN: [id number] Arrival date & time: 11/14/23  1048     History  Chief Complaint  Patient presents with   Abdominal Pain   Nausea   Emesis   Vaginal Bleeding    Jody Gould is a 33 y.o. female with no significant past medical history presents the ED today for abdominal pain.  Patient reports pain to the lower abdomen and low back that began this morning with associated nausea and vomiting.  States that she has been having vaginal bleeding since last night.  Denies passing blood clots or having to change tampons/pads every hour.  Tells me that she only has vaginal bleeding when she throws up, despite saying vomiting started this morning and the vaginal bleeding last night.  States that pain feels similar to when she had a miscarriage in the past.  She is currently 4 months postpartum.  Cannot tell me whether or not she is having vaginal discharge.     Home Medications Prior to Admission medications   Medication Sig Start Date End Date Taking? Authorizing Provider  albuterol  (VENTOLIN  HFA) 108 (90 Base) MCG/ACT inhaler Inhale 2 puffs into the lungs as needed for wheezing or shortness of breath. 09/21/19  Yes [provider]  cephALEXin (KEFLEX) 500 MG capsule Take 1 capsule (500 mg total) by mouth 4 (four) times daily for 7 days. 11/14/23 11/21/23 Yes Sonnie Dusky, PA-C  ondansetron  (ZOFRAN -ODT) 4 MG disintegrating tablet Take 1 tablet (4 mg total) by mouth every 8 (eight) hours as needed for up to 7 days for nausea or vomiting. 11/14/23 11/21/23 Yes Sonnie Dusky, PA-C      Allergies    Patient has no known allergies.    Review of Systems   Review of Systems  Gastrointestinal:  Positive for abdominal pain.  All other systems reviewed and are negative.   Physical Exam Updated Vital Signs BP (!) 147/94   Pulse 78   Temp 98.6 F (37 C) (Oral)   Resp 16   SpO2 100%   Breastfeeding Unknown   Physical Exam Vitals and nursing note reviewed. Exam conducted with a chaperone present.  Constitutional:      Appearance: Normal appearance.     Comments: Patient is laying in bed with her eyes closed, not cooperative with examination  HENT:     Head: Normocephalic and atraumatic.     Mouth/Throat:     Mouth: Mucous membranes are moist.  Eyes:     Conjunctiva/sclera: Conjunctivae normal.     Pupils: Pupils are equal, round, and reactive to light.  Cardiovascular:     Rate and Rhythm: Normal rate and regular rhythm.     Pulses: Normal pulses.     Heart sounds: Normal heart sounds.  Pulmonary:     Effort: Pulmonary effort is normal.     Breath sounds: Normal breath sounds.  Abdominal:     Palpations: Abdomen is soft.     Tenderness: There is abdominal tenderness. There is right CVA tenderness and left CVA tenderness.     Comments: Suprapubic tenderness to palpation  Genitourinary:    General: Normal vulva.     Comments: Blood present in vaginal vault on exam with tenderness to palpation of the uterus. No cervical motion tenderness. Skin:    General: Skin is warm and dry.     Findings: No rash.  Neurological:     General: No focal deficit present.  Mental Status: She is alert.  Psychiatric:        Mood and Affect: Mood normal.        Behavior: Behavior normal.    ED Results / Procedures / Treatments   Labs (all labs ordered are listed, but only abnormal results are displayed) Labs Reviewed  COMPREHENSIVE METABOLIC PANEL WITH GFR - Abnormal; Notable for the following components:      Result Value   Glucose, Bld 112 (*)    AST 14 (*)    All other components within normal limits  CBC WITH DIFFERENTIAL/PLATELET - Abnormal; Notable for the following components:   MCV 78.2 (*)    RDW 15.9 (*)    All other components within normal limits  HCG, QUANTITATIVE, PREGNANCY - Abnormal; Notable for the following components:   hCG, Beta Chain, Quant, S 5 (*)    All other  components within normal limits  URINALYSIS, ROUTINE W REFLEX MICROSCOPIC - Abnormal; Notable for the following components:   APPearance CLOUDY (*)    Hgb urine dipstick LARGE (*)    Ketones, ur 20 (*)    Protein, ur 100 (*)    Leukocytes,Ua SMALL (*)    All other components within normal limits  WET PREP, GENITAL  URINE CULTURE  GC/CHLAMYDIA PROBE AMP ([place]) NOT AT [facility]    EKG None  Radiology US  OB Comp Less 14 Wks Result Date: 11/14/2023 CLINICAL DATA:  Provided history: Miscarriage Beta hCG of 5.  Unknown LMP. EXAM: OBSTETRIC <14 WK ULTRASOUND TECHNIQUE: Transabdominal ultrasound was performed for evaluation of the gestation as well as the maternal uterus and adnexal regions. Patient declined transvaginal exam. COMPARISON:  None Available. FINDINGS: Intrauterine gestational sac: None Yolk sac:  Not Visualized. Embryo:  Not Visualized. Maternal uterus/adnexae: No adnexal mass. The right ovary is not visualized. The left ovary is normal. No pelvic free fluid. IMPRESSION: No intrauterine pregnancy or findings suspicious for ectopic pregnancy. Findings are consistent with pregnancy of unknown location and may reflect early intrauterine pregnancy not yet visualized sonographically, occult ectopic pregnancy, or failed pregnancy. Recommend trending of beta HCG and sonographic follow-up as clinically indicated. Electronically Signed   By: Chadwick Colonel M.D.   On: 11/14/2023 15:23    Procedures Procedures    Medications Ordered in ED Medications  ondansetron  (ZOFRAN -ODT) disintegrating tablet 4 mg (4 mg Oral Given 11/14/23 1251)    ED Course/ Medical Decision Making/ A&P                                 Medical Decision Making Amount and/or Complexity of Data Reviewed Labs: ordered. Radiology: ordered.  Risk Prescription drug management.   This patient presents to the ED for concern of abdominal pain and vaginal bleeding, this involves an extensive number of treatment  options, and is a complaint that carries with it a high risk of complications and morbidity.   Differential diagnosis includes: UTI, STI, PID, IUP, ectopic pregnancy, miscarriage, normal menstrual cycle, etc.   Comorbidities  4 months post-partum after vaginal delivery   Additional History  Additional history obtained from prior records   Lab Tests  I ordered and personally interpreted labs.  The pertinent results include:   CMP and CBC are reassuring UA shows small leukocytes and large hemoglobin.  No bacteria present.  Urine culture pending. Will treat preemptively for UTI due to suprapubic discomfort and possible pregnancy. Negative wet prep GC/chlamydia testing pending  Imaging Studies  I ordered imaging studies including US  OB <14 weeks  I independently visualized and interpreted imaging which showed:  No intrauterine pregnancy or findings suspicious for ectopic pregnancy.  Findings consistent with pregnancy of unknown location and may reflect early intrauterine pregnancy not yet visualized sonographically, at called ectopic pregnancy, or failed pregnancy. I agree with the radiologist interpretation   Problem List / ED Course / Critical Interventions / Medication Management  Patient reports she is about 4 months postpartum and started having vaginal bleeding last night.  Denies passing blood clots.  Woke up this morning with lower abdominal pain as well as low back pain, nausea, and vomiting.  No fevers.  Denies any pain with urination or changes to bowel habits.  Not sure whether or not she is having vaginal discharge. On reevaluation patient has been more cooperative than on initial eval. I ordered medications including: Zofran  for nausea/vomiting Reevaluation of the patient after these medicines showed that the patient improved I have reviewed the patients home medicines and have made adjustments as needed. Patient instructed to call OBGYN tomorrow for appointment for  repeat beta HCG and ultrasound for reevaluation. Patient agreeable. Patient staffed with my attending, Dr. Zackowski.   Social Determinants of Health  Tobacco use   Test / Admission - Considered  Discussed findings with patient.  All questions were answered. She is stable and safe for discharge home. Advised close OBGYN follow-up. Return precautions given.       Final Clinical Impression(s) / ED Diagnoses Final diagnoses:  Vaginal bleeding  Lower urinary tract infectious disease    Rx / DC Orders ED Discharge Orders          Ordered    ondansetron  (ZOFRAN -ODT) 4 MG disintegrating tablet  Every 8 hours PRN        11/14/23 1547    cephALEXin (KEFLEX) 500 MG capsule  4 times daily        11/14/23 1547              Sonnie Dusky, PA-C 11/14/23 1606    Zackowski, Scott, MD 11/14/23 1705

## 2023-11-14 NOTE — ED Triage Notes (Signed)
 EMS reports from home, called out for abdominal pain, nausea and vomiting since this morning. Pt states abnormal vaginal bleeding since last night. Pt post 3 months vaginal delivery, no complications. Pt states she has had miscarriages in the past and this feels the same.  BP 166/100 HR 70 RR 18 Sp02 99 RA CBG 122  20ga LAC 4mg  Zofran 

## 2023-11-14 NOTE — Discharge Instructions (Signed)
 As discussed, your imaging results showed that you could have an early pregnancy or possible miscarriage.  Please call your OB/GYN tomorrow to schedule an appointment for repeat beta hCG levels and repeat ultrasound.  Absent prescription of Zofran  to the pharmacy to take as needed for nausea and vomiting.  Take Keflex 4 times a day for the next week to treat UTI.  Get help right away if: You faint. You have to change pads every hour. You have pain in your belly. You have a fever or chills. You get sweaty or weak. You pass large blood clots from your vagina.

## 2023-11-16 LAB — GC/CHLAMYDIA PROBE AMP (~~LOC~~) NOT AT ARMC
Chlamydia: NEGATIVE
Comment: NEGATIVE
Comment: NORMAL
Neisseria Gonorrhea: NEGATIVE

## 2023-11-16 LAB — URINE CULTURE: Culture: >=100000 — AB

## 2023-11-17 ENCOUNTER — Telehealth (HOSPITAL_BASED_OUTPATIENT_CLINIC_OR_DEPARTMENT_OTHER): Payer: Self-pay

## 2023-11-17 NOTE — Telephone Encounter (Signed)
 Post ED Visit - Positive Culture Follow-up: Successful Patient Follow-Up  Culture assessed and recommendations reviewed by:  []  Court Distance, Pharm.D. []  Skeet Duke, Pharm.D., BCPS AQ-ID []  Leslee Rase, Pharm.D., BCPS [x]  Garland Junk, Pharm.D., BCPS []  [place], [address].D., BCPS, AAHIVP []  Alcide Aly, Pharm.D., BCPS, AAHIVP []  Jerri Morale, PharmD, BCPS []  Graham Laws, PharmD, BCPS []  Cleda Curly, PharmD, BCPS []  Tamar Fairly, PharmD  Positive urine culture  []  Patient discharged without antimicrobial prescription and treatment is now indicated [x]  Organism is resistant to prescribed ED discharge antimicrobial []  Patient with positive blood cultures  Changes discussed with ED provider: Auston Blush, MD New antibiotic prescription Amoxicillin  500 mg po Q 8 hours x 7 days Called to [employer] patient, date 11/17/2023, time 11:25   Delena Feil 11/17/2023, 11:27 AM

## 2023-11-17 NOTE — Progress Notes (Signed)
 ED Antimicrobial Stewardship Positive Culture Follow Up   Jody Gould is an 33 y.o. female who presented to [facility] on 11/14/2023 with a chief complaint of  Chief Complaint  Patient presents with   Abdominal Pain   Nausea   Emesis   Vaginal Bleeding    Recent Results (from the past 720 hours)  Wet prep, genital     Status: None   Collection Time: 11/14/23 12:45 PM   Specimen: Vaginal  Result Value Ref Range Status   Yeast Wet Prep HPF POC NONE SEEN NONE SEEN Final    Comment: Swab received with less than 0.5 mL of saline, saline added to specimen, interpret results with caution.   Trich, Wet Prep NONE SEEN NONE SEEN Final   Clue Cells Wet Prep HPF POC NONE SEEN NONE SEEN Final   WBC, Wet Prep HPF POC <10 <10 Final   Sperm NONE SEEN  Final    Comment: Performed at [facility], [address] [place], [place] [address]  Urine Culture     Status: Abnormal   Collection Time: 11/14/23  3:19 PM   Specimen: Urine, Clean Catch  Result Value Ref Range Status   Specimen Description URINE, CLEAN CATCH  Final   Special Requests NONE  Final   Culture >=100,000 COLONIES/mL ENTEROCOCCUS FAECALIS (A)  Final   Report Status 11/16/2023 FINAL  Final   Organism ID, Bacteria ENTEROCOCCUS FAECALIS (A)  Final      Susceptibility   Enterococcus faecalis - MIC*    AMPICILLIN <=2 SENSITIVE Sensitive     NITROFURANTOIN <=16 SENSITIVE Sensitive     VANCOMYCIN 2 SENSITIVE Sensitive     * >=100,000 COLONIES/mL ENTEROCOCCUS FAECALIS    [x]  Treated with cephalexin, organism resistant to prescribed antimicrobial.   32 YOF 4 months postpartum, presenting with abdominal pain, low back pain, nausea, vomiting, vaginal bleeding. Was discharged on cephalexin, but culture has grown enterococcus faecalis. Cephalexin does not cover for enterococcal species, and based on sensitivity to ampicillin, recommend a change in antibiotic to amoxicillin .   New antibiotic prescription: amoxicillin   500 mg every 8 hours x 7 days.   ED Provider: Auston Blush, MD   Winnie Haver, PharmD Candidate [phone] [employer] [facility] [employer] 11/17/2023 8:33 AM
